# Patient Record
Sex: Female | Born: 1995
Health system: Southern US, Community
[De-identification: ages and names within clinical notes are randomized; demographics above are authoritative.]

## PROBLEM LIST (undated history)

## (undated) DIAGNOSIS — T7840XA Allergy, unspecified, initial encounter: Secondary | ICD-10-CM

## (undated) DIAGNOSIS — R51 Headache: Secondary | ICD-10-CM

## (undated) DIAGNOSIS — R519 Headache, unspecified: Secondary | ICD-10-CM

## (undated) HISTORY — DX: Allergy, unspecified, initial encounter: T78.40XA

## (undated) HISTORY — DX: Headache, unspecified: R51.9

## (undated) HISTORY — DX: Headache: R51

## (undated) HISTORY — PX: MYRINGOTOMY WITH TUBE PLACEMENT: SHX5663

---
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
## 2017-01-14 ENCOUNTER — Telehealth: Payer: Self-pay | Admitting: Internal Medicine

## 2017-01-14 NOTE — Telephone Encounter (Signed)
Lm to call office

## 2017-01-14 NOTE — Telephone Encounter (Signed)
Pt mom called stating she would like to get a copy of her immunization record. Please and thank you!   Call mom @ [phone].

## 2017-01-17 NOTE — Telephone Encounter (Signed)
lmtrc

## 2017-01-21 NOTE — Telephone Encounter (Signed)
Called mom x 3 l/m that we will not attempt to contact. If she still has questions about immunizations to call our office.

## 2017-03-19 ENCOUNTER — Ambulatory Visit
Admission: EM | Admit: 2017-03-19 | Discharge: 2017-03-19 | Disposition: A | Discharge status: Home / Self Care | Payer: 59 | Attending: Registered Nurse | Admitting: Registered Nurse

## 2017-03-19 ENCOUNTER — Telehealth: Payer: Self-pay | Admitting: Internal Medicine

## 2017-03-19 DIAGNOSIS — R000 Tachycardia, unspecified: Secondary | ICD-10-CM | POA: Diagnosis not present

## 2017-03-19 DIAGNOSIS — J301 Allergic rhinitis due to pollen: Secondary | ICD-10-CM | POA: Diagnosis not present

## 2017-03-19 DIAGNOSIS — R232 Flushing: Secondary | ICD-10-CM

## 2017-03-19 DIAGNOSIS — R61 Generalized hyperhidrosis: Secondary | ICD-10-CM | POA: Diagnosis not present

## 2017-03-19 DIAGNOSIS — I10 Essential (primary) hypertension: Secondary | ICD-10-CM | POA: Diagnosis not present

## 2017-03-19 DIAGNOSIS — H6593 Unspecified nonsuppurative otitis media, bilateral: Secondary | ICD-10-CM | POA: Insufficient documentation

## 2017-03-19 DIAGNOSIS — R51 Headache: Secondary | ICD-10-CM | POA: Diagnosis present

## 2017-03-19 DIAGNOSIS — J0120 Acute ethmoidal sinusitis, unspecified: Secondary | ICD-10-CM | POA: Diagnosis not present

## 2017-03-19 LAB — CBC WITH DIFFERENTIAL/PLATELET
Basophils Absolute: 0 10*3/uL (ref 0–0.1)
Basophils Relative: 0 %
Eosinophils Absolute: 0 10*3/uL (ref 0–0.7)
Eosinophils Relative: 0 %
HCT: 45.6 % (ref 35.0–47.0)
Hemoglobin: 15.5 g/dL (ref 12.0–16.0)
Lymphocytes Relative: 13 %
Lymphs Abs: 1.5 10*3/uL (ref 1.0–3.6)
MCH: 29.2 pg (ref 26.0–34.0)
MCHC: 33.9 g/dL (ref 32.0–36.0)
MCV: 86.1 fL (ref 80.0–100.0)
Monocytes Absolute: 0.3 10*3/uL (ref 0.2–0.9)
Monocytes Relative: 2 %
Neutro Abs: 9.7 10*3/uL — ABNORMAL HIGH (ref 1.4–6.5)
Neutrophils Relative %: 85 %
Platelets: 284 10*3/uL (ref 150–440)
RBC: 5.3 MIL/uL — ABNORMAL HIGH (ref 3.80–5.20)
RDW: 12.5 % (ref 11.5–14.5)
WBC: 11.6 10*3/uL — ABNORMAL HIGH (ref 3.6–11.0)

## 2017-03-19 LAB — COMPREHENSIVE METABOLIC PANEL
ALBUMIN: 5.5 g/dL — AB (ref 3.5–5.0)
ALT: 12 U/L — AB (ref 14–54)
ANION GAP: 9 (ref 5–15)
AST: 22 U/L (ref 15–41)
Alkaline Phosphatase: 98 U/L (ref 38–126)
BUN: 11 mg/dL (ref 6–20)
CALCIUM: 10 mg/dL (ref 8.9–10.3)
CO2: 28 mmol/L (ref 22–32)
CREATININE: 0.62 mg/dL (ref 0.44–1.00)
Chloride: 102 mmol/L (ref 101–111)
GFR calc Af Amer: >60 mL/min (ref >60)
GFR calc non Af Amer: >60 mL/min (ref >60)
GLUCOSE: 111 mg/dL — AB (ref 65–99)
Potassium: 3.7 mmol/L (ref 3.5–5.1)
Sodium: 139 mmol/L (ref 135–145)
TOTAL PROTEIN: 9.6 g/dL — AB (ref 6.5–8.1)
Total Bilirubin: 0.7 mg/dL (ref 0.3–1.2)

## 2017-03-19 LAB — TSH: TSH: 1.35 u[IU]/mL (ref 0.350–4.500)

## 2017-03-19 LAB — MONONUCLEOSIS SCREEN: Mono Screen: NEGATIVE

## 2017-03-19 LAB — HCG, QUANTITATIVE, PREGNANCY

## 2017-03-19 MED ORDER — SALINE SPRAY 0.65 % NA SOLN: 2.0000 | NASAL | 0 refills | Status: DC

## 2017-03-19 MED ORDER — AMOXICILLIN 875 MG PO TABS
875.0000 mg | ORAL_TABLET | Freq: Two times a day (BID) | ORAL | 0 refills | Status: AC
Start: 2017-03-19 — End: 2017-03-29

## 2017-03-19 MED ORDER — FLUTICASONE PROPIONATE 50 MCG/ACT NA SUSP: 1.0000 | Freq: Two times a day (BID) | NASAL | 0 refills | Status: DC

## 2017-03-19 MED ORDER — PREDNISONE 50 MG PO TABS: ORAL_TABLET | ORAL | 0 refills | Status: DC

## 2017-03-19 NOTE — Telephone Encounter (Signed)
Is she having acute symptoms now?  If so, then needs to be seen more acutely to try and confirm etiology - then I can f/u after.   If calling because having intermittent episodes, will have to find work in appt.

## 2017-03-19 NOTE — Telephone Encounter (Signed)
Pt mom called and stated that for the past 2 months the pt would start profusely sweating out of no where along with hand tremors and nausea. Pt has this about 1 to 2 times a week. Pt mom would like to have an appt with Dr. Nicki Reaper.  Please advise, thank you!  Call pt @ [phone]

## 2017-03-19 NOTE — Telephone Encounter (Signed)
Spoke with Horris Latino per DPR  Symptoms were occurring at 1:00 pm today.   Patient states she feels very fatigued, achy, nauseous, has headache.  Mom is going to get her and take to urgent care for further work up.   She will let us know what she finds out.

## 2017-03-19 NOTE — Telephone Encounter (Signed)
Reason for call:  Diffused sweating, Symptoms: feels like hot flashes, does get headache,  hands tremors, profuse sweating, nausea, felt like eyes crossing  sweating, hands trembling, last time ate 12:30 pm 100 pm , appetite decreased  Duration 2 months  No family history of diabetes , no injuries, no falls,  Medications:imipramine  Last seen for this problem: Seen by: Spoke with mom Horris Latino [phone]  Please advise

## 2017-03-19 NOTE — ED Triage Notes (Signed)
Patient complains of episodes of sweating, hot flashes, headaches, nausea. Patient states that this episode occurred around 1pm today. Patient states that she called primary and they told her to come in here. Patient states that she felt like her legs are weak.

## 2017-03-19 NOTE — ED Provider Notes (Signed)
CSN: [id number]     Arrival date & time 03/19/17  1757 History   First MD Initiated Contact with Patient 03/19/17 1828     Chief Complaint  Patient presents with  . Headache   (Consider location/radiation/quality/duration/timing/severity/associated sxs/prior Treatment) 21y/o single caucasian female here for evaluation of recurrent hot flashes, sweating not feeling well, occasionally hand tremors that started two months ago.  They do not occur daily but at least weekly. Unsure if her pulse elevates when these episodes occur denied history of palpitations, high/low blood sugars, electrolyte imbalances, anemia. Parents accompanying patient in exam room.  Had mononucleosis 1 year ago found on routine exam with PCM denied nausea/vomiting/fatigue/abdomen pain.  Today at work started to get headache and not feeling well.  Not her usual headache.  Some pressure behind eyes/bridge of nose,sore throat upon awakening/post nasal drip; has noticed increased runny nose/watery eyes has been taking her allegra the past week. 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 Parents brought her to urgent care after work.  Appetite regular denied body aches, nausea, vomiting, diarrhea, abdomen pain. Some kids with rash (ringworm)/URI at work.  Only 1 international child from [place] at daycare.  Had PPD performed for her [facility] transfer negative/normal in past year.   Denied weight loss/weight gain  Has a boyfriend denied sexual activity not on birth control  LMP started yesterday regular every month  Per mother patient has been tested for MS in the past due to mother diagnosis and child negative workup  PMHx recurrent ear infections, headaches, seasonal allergies typically spring PSHx PE Tubes  FHx Mother MS and palpitations (doesn't register on EKG when she has them)  MGF-PVCs, pacemaker and ablation mother and patient denied family  history diabetes, thyroid disorders      Past Medical History:  Diagnosis Date  . Allergy   . Headache    Past Surgical History:  Procedure Laterality Date  . MYRINGOTOMY WITH TUBE PLACEMENT     x 2   Family History  Problem Relation Age of Onset  . Multiple sclerosis Mother   . Breast cancer      grandmother  . Multiple sclerosis      mother  . Heart disease      grandparent  . Multiple sclerosis Maternal Uncle    Social History  Substance Use Topics  . Smoking status: Never Smoker  . Smokeless tobacco: Never Used  . Alcohol use No   OB History    No data available     Review of Systems  Constitutional: Positive for diaphoresis. Negative for activity change, appetite change, chills, fatigue and fever.  HENT: Positive for congestion, postnasal drip, rhinorrhea, sinus pain, sinus pressure, sneezing and sore throat. Negative for dental problem, drooling, ear discharge, ear pain, facial swelling, hearing loss, mouth sores, nosebleeds, tinnitus, trouble swallowing and voice change.   Eyes: Positive for pain, discharge and itching. Negative for photophobia, redness and visual disturbance.  Respiratory: Negative for apnea, cough, choking, chest tightness, shortness of breath, wheezing and stridor.   Cardiovascular: Negative for chest pain, palpitations and leg swelling.  Gastrointestinal: Negative for abdominal distention, abdominal pain, anal bleeding, blood in stool, constipation, diarrhea, nausea, rectal pain and vomiting.  Endocrine: Negative for cold intolerance, heat intolerance, polydipsia, polyphagia and polyuria.  Genitourinary: Positive for vaginal bleeding. Negative for decreased urine volume, difficulty urinating, flank pain, frequency, hematuria, menstrual problem,  pelvic pain, vaginal discharge and vaginal pain.  Musculoskeletal: Negative for arthralgias, back pain, gait problem, joint swelling, myalgias, neck pain and neck stiffness.  Skin: Negative for color  change, pallor, rash and wound.  Allergic/Immunologic: Positive for environmental allergies. Negative for food allergies and immunocompromised state.  Neurological: Positive for tremors and headaches. Negative for dizziness, seizures, syncope, facial asymmetry, speech difficulty, weakness, light-headedness and numbness.  Hematological: Negative for adenopathy. Does not bruise/bleed easily.  Psychiatric/Behavioral: Negative for agitation, behavioral problems, confusion, decreased concentration, hallucinations, self-injury, sleep disturbance and suicidal ideas. The patient is not nervous/anxious and is not hyperactive.     Allergies  Patient has no known allergies.  Home Medications   Prior to Admission medications   Medication Sig Start Date End Date Taking? Authorizing Provider  fexofenadine (ALLEGRA) 180 MG tablet Take 180 mg by mouth as needed for allergies or rhinitis.   Yes Historical Provider, MD  imipramine (TOFRANIL) 10 MG tablet One or two at bedtime 08/23/16  Yes Britt Bottom, MD  amoxicillin (AMOXIL) 875 MG tablet Take 1 tablet (875 mg total) by mouth 2 (two) times daily. 03/19/17 03/29/17  Olen Cordial, NP  fluticasone (FLONASE) 50 MCG/ACT nasal spray Place 1 spray into both nostrils 2 (two) times daily. 03/19/17   Olen Cordial, NP  predniSONE (DELTASONE) 50 MG tablet Take 50mg  po daily with breakfast 03/19/17   Olen Cordial, NP  sodium chloride (OCEAN) 0.65 % SOLN nasal spray Place 2 sprays into both nostrils every 2 (two) hours while awake. 03/19/17 04/18/17  Olen Cordial, NP   Meds Ordered and Administered this Visit  Medications - No data to display  BP 125/84 (BP Location: Left Arm)   Pulse (!) 111   Temp 98.2 F (36.8 C) (Oral)   Resp 18   Wt 125 lb (56.7 kg)   LMP 03/18/2017   SpO2 100%   BMI 21.97 kg/m  No data found.   Physical Exam  Constitutional: She is oriented to person, place, and time. She appears well-developed and well-nourished. She  is active and cooperative.  Non-toxic appearance. She does not have a sickly appearance. She does not appear ill. No distress.  HENT:  Head: Normocephalic and atraumatic.  Right Ear: Hearing, external ear and ear canal normal. Tympanic membrane is scarred. A middle ear effusion is present.  Left Ear: Hearing, external ear and ear canal normal. Tympanic membrane is scarred. A middle ear effusion is present.  Nose: Mucosal edema and rhinorrhea present. No nose lacerations, sinus tenderness, nasal deformity, septal deviation or nasal septal hematoma. No epistaxis.  No foreign bodies. Right sinus exhibits no maxillary sinus tenderness and no frontal sinus tenderness. Left sinus exhibits no maxillary sinus tenderness and no frontal sinus tenderness.  Mouth/Throat: Uvula is midline and mucous membranes are normal. Mucous membranes are not pale, not dry and not cyanotic. She does not have dentures. No oral lesions. No trismus in the jaw. Normal dentition. No dental abscesses, uvula swelling, lacerations or dental caries. Posterior oropharyngeal edema and posterior oropharyngeal erythema present. No oropharyngeal exudate or tonsillar abscesses. Tonsils are 1+ on the right. Tonsils are 1+ on the left. No tonsillar exudate.  Cobblestoning posterior pharynx; bilateral nasal turbinates edema/erythema clear discharge; scarring bilateral TMs (12-6pm) and air fluid level clear; proximal external auditory canal debris adjacent to TM left white; bilateral allergic shiners  Eyes: Conjunctivae, EOM and lids are normal. Pupils are equal, round, and reactive to light. Right eye exhibits no chemosis, no  discharge, no exudate and no hordeolum. No foreign body present in the right eye. Left eye exhibits no chemosis, no discharge, no exudate and no hordeolum. No foreign body present in the left eye. Right conjunctiva is not injected. Right conjunctiva has no hemorrhage. Left conjunctiva is not injected. Left conjunctiva has no  hemorrhage. No scleral icterus. Right eye exhibits normal extraocular motion and no nystagmus. Left eye exhibits normal extraocular motion and no nystagmus. Right pupil is round and reactive. Left pupil is round and reactive. Pupils are equal.  Neck: Trachea normal, normal range of motion and phonation normal. Neck supple. No tracheal tenderness, no spinous process tenderness and no muscular tenderness present. No neck rigidity. No tracheal deviation, no edema, no erythema and normal range of motion present. No thyroid mass and no thyromegaly present.  Cardiovascular: Normal rate, regular rhythm, S1 normal, S2 normal, normal heart sounds and intact distal pulses.  PMI is not displaced.  Exam reveals no gallop and no friction rub.   No murmur heard. Pulses:      Radial pulses are 2+ on the right side, and 2+ on the left side.  During initial physical exam heart rate 85 and during discharge patient stated she started to feel hot repeat pulse 130 regular rhythm radial and EKG ordered to verify presence/absence arrhythmia, PACs, PVCs none noted but patient became flushed and was not feeling well similar to her reported symptoms from the previous two months  Pulmonary/Chest: Effort normal and breath sounds normal. No accessory muscle usage or stridor. No respiratory distress. She has no decreased breath sounds. She has no wheezes. She has no rhonchi. She has no rales. She exhibits no tenderness.  Abdominal: Soft. Normal appearance and bowel sounds are normal. She exhibits no shifting dullness, no distension, no pulsatile liver, no fluid wave, no abdominal bruit, no ascites, no pulsatile midline mass and no mass. There is no hepatosplenomegaly, splenomegaly or hepatomegaly. There is no tenderness. There is no rigidity, no rebound, no guarding, no CVA tenderness, no tenderness at McBurney's point and negative Murphy's sign. No hernia. Hernia confirmed negative in the ventral area.  Dull to percussion RLQ, LLQ, RUQ  and tympanny LUQ; normoactive bowel sounds x 4 quads  Musculoskeletal: Normal range of motion. She exhibits no edema or tenderness.       Right shoulder: Normal.       Left shoulder: Normal.       Right hip: Normal.       Left hip: Normal.       Right knee: Normal.       Left knee: Normal.       Right ankle: Normal.       Left ankle: Normal.       Cervical back: Normal.       Right hand: Normal.       Left hand: Normal.  Lymphadenopathy:       Head (right side): No submental, no submandibular, no tonsillar, no preauricular, no posterior auricular and no occipital adenopathy present.       Head (left side): No submental, no submandibular, no tonsillar, no preauricular, no posterior auricular and no occipital adenopathy present.    She has no cervical adenopathy.       Right cervical: No superficial cervical, no deep cervical and no posterior cervical adenopathy present.      Left cervical: No superficial cervical, no deep cervical and no posterior cervical adenopathy present.  Neurological: She is alert and oriented to person, place,  and time. She has normal strength. She is not disoriented. She displays no atrophy and no tremor. No cranial nerve deficit or sensory deficit. She exhibits normal muscle tone. She displays no seizure activity. Coordination and gait normal. GCS eye subscore is 4. GCS verbal subscore is 5. GCS motor subscore is 6.  Skin: Skin is warm, dry and intact. Capillary refill takes less than 2 seconds. No abrasion, no bruising, no burn, no ecchymosis, no laceration, no lesion, no petechiae and no rash noted. She is not diaphoretic. No cyanosis or erythema. No pallor. Nails show no clubbing.  Psychiatric: She has a normal mood and affect. Her speech is normal and behavior is normal. Judgment and thought content normal. She is not actively hallucinating. Cognition and memory are normal. She is attentive.  Nursing note and vitals reviewed.   Urgent Care Course      .EKG Date/Time: 03/19/2017 7:47 PM Performed by: Gerarda Fraction A Authorized by: Gerarda Fraction A   ECG reviewed by ED Physician in the absence of a cardiologist: no   Previous ECG:    Previous ECG:  Unavailable Interpretation:    Interpretation: non-specific   Rate:    ECG rate:  119   ECG rate assessment: tachycardic   Rhythm:    Rhythm: sinus tachycardia   Ectopy:    Ectopy: none   QRS:    QRS axis:  Normal   QRS intervals:  Normal Conduction:    Conduction: normal   ST segments:    ST segments:  Normal T waves:    T waves: normal   Comments:     Pr interval 149ms QRS duration 31ms Qt/Qtc 316/453ms PRT axes 66 59 57    (including critical care time)  Labs Review Labs Reviewed  CBC WITH DIFFERENTIAL/PLATELET - Abnormal; Notable for the following:       Result Value   WBC 11.6 (*)    RBC 5.30 (*)    Neutro Abs 9.7 (*)    All other components within normal limits  COMPREHENSIVE METABOLIC PANEL - Abnormal; Notable for the following:    Glucose, Bld 111 (*)    Total Protein 9.6 (*)    Albumin 5.5 (*)    ALT 12 (*)    All other components within normal limits  TSH  MONONUCLEOSIS SCREEN  HCG, QUANTITATIVE, PREGNANCY  HEMOGLOBIN A1C    Imaging Review No results found.  1933 Reviewed labs results with patient and parents and given copy.  Discussed elevated RBC, WBC, neutro Abs, glucose, total protein, albumin.  Less than normal ALT.  Compared these results to one year ago also. Pregnancy test negative, mononucleosis negative.  TSH and HgbA1C still pending will call when results available.  Mother's cell Horris Latino) [phone]  May leave message if no answer. Parents and patient verbalized understanding information and had no further questions at this time  1943 patient started to not feel well flushed and mother noted on patient apple watch heart rate 126.  Ordered EKG.  Patient reported these symptoms similar to those she had been experiencing over the  past two months.  EKG tachycardia no PVCs/PACs noted.  Radial pulse regular.  Patient cheeks became flushed.  We had her lie down and applied cool compress to forehead.  Shirt was removed for EKG and she was wearing sports bra felt better/"hot flash"resolved and pulse returned to baseline 112 on admit/triage.  Discussed with parents and patient that holter monitor/cardiac workup may be indicated due to family history of  palpitations/PVCs mother and grandfather.  Departed ambulatory in NAD with parents driving her home.    MDM   1. Acute non-recurrent ethmoidal sinusitis   2. Otitis media with effusion, bilateral   3. Seasonal allergic rhinitis due to pollen   4. Diaphoresis   5. Tachycardia    Patient refused work/school excuse x 24 hours.  Restart flonase 1 spray each nostril BID at home.  Nasal saline 2 sprays each nostril q2h wa.  Shower bid.  Continue allegra po daily.  Start amoxicillin 875mg  po BID x 10 days #20 RF0. Discussed amoxicillin also treats for strep throat and ear infections. If no improvement with sinus pain/pressure x 48 hours amoxicillin/flonase/saline/allegra fill prednisone 50mg  po daily with breakfast x 5 days #5 RF0.  Discussed with patient and parents prednisone can increase or decrease appetite, cause insomnia.  If start prednisone and headache occurs stop as it can raise blood pressure and interact with her tofranil to raise blood pressure more than usual.  Mother has blood pressure cuff at home and can check her daughter's BP.  Mother works in healthcare also.  Recommended repeat labs in 2-4 weeks after current illness resolves.  Sooner if new or worsening symptoms. No evidence of systemic bacterial infection, non toxic and well hydrated.  I do not see where any further testing or imaging is necessary at this time.   I will suggest supportive care, rest, good hygiene and encourage the patient to take adequate fluids.  The patient is to return to clinic or EMERGENCY ROOM if  symptoms worsen or change significantly.  Exitcare handout on sinusitis given to patient.  Patient and parents verbalized agreement and understanding of treatment plan and had no further questions at this time.   P2:  Hand washing and cover cough   Discussed possible causes of "hot flashes"/sweating/not feeling well with patient and parents e.g. Hyper/hypoglyceia, hyper/hypothyroidism, diabetes, hormonal changes, electrolyte imbalances, cardiac disturbances e.g. PVCs/PSVT/SVT, pregnancy, infections.  Labs drawn EKG in clinic  EKG tachycardia without PACs/PVCs.  Lab Results available prior to discharge discussed with parents and patient.  Will call with others typically within next 48 hours as ship out to [employer].  Patient and parents verbalized understanding and had no further questions at this time.  Patient may use normal saline nasal spray as needed.  Continue  antihistamine (allegra) and restart nasal steroid (flonase at home) use.  Discussed flonase also can be used to treat viral and allergic rhinitis/sinusitis whereas amoxicillin only covers bacterial sinusitis not allergic/viral.  Avoid triggers if possible.  Shower prior to bedtime if exposed to triggers.  If allergic dust/dust mites recommend mattress/pillow covers/encasements; washing linens, vacuuming, sweeping, dusting weekly.  Call or return to clinic as needed if these symptoms worsen or fail to improve as anticipated.   Exitcare handout on allergic rhinitis given to patient.  Patient verbalized understanding of instructions, agreed with plan of care and had no further questions at this time.  P2:  Avoidance and hand washing.   Supportive treatment. Tylenol 1000mg  po QID prn.  Discussed motrin/advil/aleve/ibuprofen/naproxen all in same family choose one and max dose motrin/advil/ibuprofen 800mg  po TID.  No evidence of invasive bacterial infection, non toxic and well hydrated.  This is most likely self limiting viral infection.  I do not see  where any further testing or imaging is necessary at this time.   I will suggest supportive care, rest, good hygiene and encourage the patient to take adequate fluids.  The patient is to return  to clinic or EMERGENCY ROOM if symptoms worsen or change significantly e.g. ear pain, fever, purulent discharge from ears or bleeding.  Exitcare handout on otitis media with effusion given to patient.  Patient verbalized agreement and understanding of treatment plan.      Olen Cordial, NP 03/20/17 [place], NP 03/20/17 [phone]

## 2017-03-20 NOTE — Telephone Encounter (Signed)
Reviewed note.  Was placed on abx, etc.  They did recommend f/u with cardiology.  Does she want me to go ahead and schedule appt with cardiology.  Then I can f/u after.  Make sure ok.  Thanks

## 2017-03-20 NOTE — Telephone Encounter (Signed)
Patient and family would like to proceed with cardiology evaluation due to patient resting Pulse 122 today but patient also started prednisone today. Patient has no symptoms at this time and advised if heart rate continued to rise or if symptoms occur needs to be evaluated at [facility] or ER .  Patient would prefer Dr. Burt Knack or Dr. Roxy Manns with [facility].

## 2017-03-20 NOTE — Telephone Encounter (Signed)
From report from urgent care patient was advised to see PCP and would need referral for cardiology patient had episode of Tachycardia at urgent care pulse 126 patient felt hot and flushed 2 ECG's were done during visit in chart. Tried to call and see how patient is today no answer left message.

## 2017-03-20 NOTE — Telephone Encounter (Signed)
Pt mom called back and stated that they went to [facility] last night and was advised that they needs to make an appt with Dr. Nicki Reaper ASAP., and that she also needs to go see a cardiologist. Please advise, thank you!  Call @ [phone]

## 2017-03-20 NOTE — Telephone Encounter (Signed)
Left message to return call to office.

## 2017-03-20 NOTE — Telephone Encounter (Signed)
Please call patients mother  Contact Horris Latino at [phone]

## 2017-03-21 ENCOUNTER — Encounter: Payer: Self-pay | Admitting: Registered Nurse

## 2017-03-21 ENCOUNTER — Telehealth: Payer: Self-pay | Admitting: Registered Nurse

## 2017-03-21 LAB — HEMOGLOBIN A1C
HEMOGLOBIN A1C: 5 % (ref 4.8–5.6)
MEAN PLASMA GLUCOSE: 97 mg/dL

## 2017-03-21 NOTE — Telephone Encounter (Signed)
I have placed the order for the referral.  See previous phone note.  Can we get an appt scheduled asap.  Thanks.   Dr Nicki Reaper

## 2017-03-21 NOTE — Telephone Encounter (Signed)
Patient's mother notified via telephone HgbA1c  5.0 estimated blood sugar 97 and TSH 1.35 both WNL.  Follow up with [facility] consider holter monitor/event monitor due to family history and observed tachycardia with similar symptoms to patient reported hot flashes in past two month.   Discussed stress/anxiety/illness can also cause tachycardia.  End of semester, working, illness and allergies flaring at this time.  Monitor diet ensure adequate protein intake as low protein/albumin--repeat labs with PCM in 4-6 weeks. Mother verbalized understanding information/instructions, agreed with plan of care and had no further questions at this time.

## 2017-03-21 NOTE — Telephone Encounter (Signed)
Thank you.  Will you let pt know if you haven't already.   Thanks again for all you do.

## 2017-03-21 NOTE — Telephone Encounter (Signed)
Referral has been sent to [facility] in [place] for [place]. They will contact the patient to get her in.

## 2017-03-22 DIAGNOSIS — R0609 Other forms of dyspnea: Secondary | ICD-10-CM | POA: Diagnosis not present

## 2017-03-22 DIAGNOSIS — R000 Tachycardia, unspecified: Secondary | ICD-10-CM | POA: Diagnosis not present

## 2017-03-27 DIAGNOSIS — R000 Tachycardia, unspecified: Secondary | ICD-10-CM | POA: Diagnosis not present

## 2017-04-11 ENCOUNTER — Ambulatory Visit: Payer: 59 | Admitting: Interventional Cardiology

## 2017-04-17 DIAGNOSIS — R000 Tachycardia, unspecified: Secondary | ICD-10-CM | POA: Diagnosis not present

## 2017-04-17 DIAGNOSIS — R0609 Other forms of dyspnea: Secondary | ICD-10-CM | POA: Diagnosis not present

## 2017-04-23 DIAGNOSIS — R000 Tachycardia, unspecified: Secondary | ICD-10-CM | POA: Diagnosis not present

## 2017-05-11 MED FILL — IMIPRAMINE HCL 10 MG TABLET: 10 | 90 days supply | Qty: 180 | Fill #2

## 2017-05-20 MED FILL — METOPROLOL SUCC ER 25 MG TA: 25 | 30 days supply | Qty: 60 | Fill #0

## 2017-06-11 ENCOUNTER — Encounter: Payer: 59 | Admitting: Internal Medicine

## 2017-06-17 ENCOUNTER — Ambulatory Visit (INDEPENDENT_AMBULATORY_CARE_PROVIDER_SITE_OTHER): Payer: 59 | Admitting: Internal Medicine

## 2017-06-17 ENCOUNTER — Encounter: Payer: Self-pay | Admitting: Internal Medicine

## 2017-06-17 DIAGNOSIS — G4700 Insomnia, unspecified: Secondary | ICD-10-CM | POA: Diagnosis not present

## 2017-06-17 DIAGNOSIS — R51 Headache: Secondary | ICD-10-CM | POA: Diagnosis not present

## 2017-06-17 DIAGNOSIS — Z0000 Encounter for general adult medical examination without abnormal findings: Secondary | ICD-10-CM | POA: Diagnosis not present

## 2017-06-17 DIAGNOSIS — R000 Tachycardia, unspecified: Secondary | ICD-10-CM | POA: Diagnosis not present

## 2017-06-17 DIAGNOSIS — R519 Headache, unspecified: Secondary | ICD-10-CM

## 2017-06-17 NOTE — Progress Notes (Signed)
Pre-visit discussion using our clinic review tool. No additional management support is needed unless otherwise documented below in the visit note.  

## 2017-06-17 NOTE — Progress Notes (Signed)
Patient ID: Karla Hughes, female   DOB: 21-Jul-1996, 21 y.o.   MRN: [id number]   Subjective:    Patient ID: Karla Hughes, female    DOB: 06-01-1996, 21 y.o.   MRN: [id number]  HPI  Patient here for her physical exam.  She is accompanied by her mother.  History obtained from both of them.   She reports she feels better.  Saw cardiology.  Had 24 hour holter monitor.  Had ECHO - EF 50% with mild TR.  Was started on metoprolol.  Dose increased when had problems with increased heart rate with exercise.  She is currently on 50mg  q day now and feels this is working well for her.  Recommended f/u in 3 months.  No chest pain.  Breathing stable.  No abdominal pain.  Bowels moving.  LMP 06/07/17.  Regular.  Seeing Dr Felecia Shelling for her headaches.  He placed her on imipramine.  Sleeping better.  Headaches better.     Past Medical History:  Diagnosis Date  . Allergy   . Headache    Past Surgical History:  Procedure Laterality Date  . MYRINGOTOMY WITH TUBE PLACEMENT     x 2   Family History  Problem Relation Age of Onset  . Multiple sclerosis Mother   . Breast cancer Unknown        grandmother  . Multiple sclerosis Unknown        mother  . Heart disease Unknown        grandparent  . Multiple sclerosis Maternal Uncle    Social History   Social History  . Marital status: Single    Spouse name: N/A  . Number of children: N/A  . Years of education: N/A   Social History Main Topics  . Smoking status: Never Smoker  . Smokeless tobacco: Never Used  . Alcohol use No  . Drug use: No  . Sexual activity: No   Other Topics Concern  . None   Social History Narrative  . None    Outpatient Encounter Prescriptions as of 06/17/2017  Medication Sig  . fexofenadine (ALLEGRA) 180 MG tablet Take 180 mg by mouth as needed for allergies or rhinitis.  Marland Kitchen imipramine (TOFRANIL) 10 MG tablet One or two at bedtime  . metoprolol succinate (TOPROL-XL) 25 MG 24 hr tablet Take 50 mg by mouth daily.  .  fluticasone (FLONASE) 50 MCG/ACT nasal spray Place 1 spray into both nostrils 2 (two) times daily. (Patient not taking: Reported on 06/17/2017)  . sodium chloride (OCEAN) 0.65 % SOLN nasal spray Place 2 sprays into both nostrils every 2 (two) hours while awake.  . [DISCONTINUED] predniSONE (DELTASONE) 50 MG tablet Take 50mg  po daily with breakfast   No facility-administered encounter medications on file as of 06/17/2017.     Review of Systems  Constitutional: Negative for appetite change and unexpected weight change.  HENT: Negative for congestion and sinus pressure.   Eyes: Negative for pain and visual disturbance.  Respiratory: Negative for cough, chest tightness and shortness of breath.   Cardiovascular: Negative for chest pain, palpitations and leg swelling.  Gastrointestinal: Negative for abdominal pain, diarrhea, nausea and vomiting.  Genitourinary: Negative for difficulty urinating and dysuria.  Musculoskeletal: Negative for back pain and joint swelling.  Skin: Negative for color change and rash.  Neurological: Negative for dizziness, light-headedness and headaches.  Hematological: Negative for adenopathy. Does not bruise/bleed easily.  Psychiatric/Behavioral: Negative for agitation and dysphoric mood.       Objective:  Physical Exam  Constitutional: She is oriented to person, place, and time. She appears well-developed and well-nourished. No distress.  HENT:  Nose: Nose normal.  Mouth/Throat: Oropharynx is clear and moist.  Eyes: Right eye exhibits no discharge. Left eye exhibits no discharge. No scleral icterus.  Neck: Neck supple. No thyromegaly present.  Cardiovascular: Normal rate and regular rhythm.   Pulmonary/Chest: Breath sounds normal. No accessory muscle usage. No tachypnea. No respiratory distress. She has no decreased breath sounds. She has no wheezes. She has no rhonchi. Right breast exhibits no inverted nipple, no mass, no nipple discharge and no tenderness (no  axillary adenopathy). Left breast exhibits no inverted nipple, no mass, no nipple discharge and no tenderness (no axilarry adenopathy).  Abdominal: Soft. Bowel sounds are normal. There is no tenderness.  Musculoskeletal: She exhibits no edema or tenderness.  Lymphadenopathy:    She has no cervical adenopathy.  Neurological: She is alert and oriented to person, place, and time.  Skin: Skin is warm. No rash noted. No erythema.  Psychiatric: She has a normal mood and affect. Her behavior is normal.    BP 126/82 (BP Location: Left Arm, Patient Position: Sitting, Cuff Size: Normal)   Pulse 98   Temp 98.6 F (37 C) (Oral)   Resp 12   Ht 5\' 3"  (1.6 m)   Wt 131 lb 6.4 oz (59.6 kg)   LMP 06/07/2017   SpO2 98%   BMI 23.28 kg/m  Wt Readings from Last 3 Encounters:  06/17/17 131 lb 6.4 oz (59.6 kg) (57 %, Z= 0.18)*  03/19/17 125 lb (56.7 kg) (46 %, Z= -0.09)*  08/23/16 129 lb 8 oz (58.7 kg) (58 %, Z= 0.20)*   * Growth percentiles are based on CDC 2-20 Years data.     Lab Results  Component Value Date   WBC 11.6 (H) 03/19/2017   HGB 15.5 03/19/2017   HCT 45.6 03/19/2017   PLT 284 03/19/2017   GLUCOSE 111 (H) 03/19/2017   ALT 12 (L) 03/19/2017   AST 22 03/19/2017   NA 139 03/19/2017   K 3.7 03/19/2017   CL 102 03/19/2017   CREATININE 0.62 03/19/2017   BUN 11 03/19/2017   CO2 28 03/19/2017   TSH 1.350 03/19/2017   HGBA1C 5.0 03/19/2017       Assessment & Plan:   Problem List Items Addressed This Visit    Headache    Improved on imipramine.  Sleeping better.  Being followed by Dr Felecia Shelling.       Relevant Medications   metoprolol succinate (TOPROL-XL) 25 MG 24 hr tablet   Healthcare maintenance    Physical today.        Insomnia    Sleeping better on imipramine.        Tachycardia    Saw cardiology.  On metoprolol.  Doing well on current dose.  Follow.           Einar Pheasant, MD

## 2017-06-19 ENCOUNTER — Encounter: Payer: Self-pay | Admitting: Internal Medicine

## 2017-06-19 DIAGNOSIS — R000 Tachycardia, unspecified: Secondary | ICD-10-CM | POA: Insufficient documentation

## 2017-06-19 DIAGNOSIS — Z0000 Encounter for general adult medical examination without abnormal findings: Secondary | ICD-10-CM | POA: Insufficient documentation

## 2017-06-19 MED FILL — METOPROLOL SUCC ER 25 MG TA: 25 | 30 days supply | Qty: 60 | Fill #1

## 2017-06-19 NOTE — Assessment & Plan Note (Signed)
Saw cardiology.  On metoprolol.  Doing well on current dose.  Follow.

## 2017-06-19 NOTE — Assessment & Plan Note (Signed)
Sleeping better on imipramine.

## 2017-06-19 NOTE — Assessment & Plan Note (Signed)
Improved on imipramine.  Sleeping better.  Being followed by Dr Felecia Shelling.

## 2017-06-19 NOTE — Assessment & Plan Note (Signed)
Physical today.

## 2017-07-16 MED FILL — METOPROLOL SUCC ER 25 MG TA: 25 | 30 days supply | Qty: 60 | Fill #2

## 2017-07-26 DIAGNOSIS — R000 Tachycardia, unspecified: Secondary | ICD-10-CM | POA: Diagnosis not present

## 2017-07-26 DIAGNOSIS — R0609 Other forms of dyspnea: Secondary | ICD-10-CM | POA: Diagnosis not present

## 2017-08-14 MED FILL — IMIPRAMINE HCL 10 MG TABLET: 10 | 90 days supply | Qty: 180 | Fill #3

## 2017-08-18 MED FILL — METOPROLOL SUCC ER 25 MG TA: 25 | 30 days supply | Qty: 60 | Fill #3

## 2017-08-27 ENCOUNTER — Ambulatory Visit (INDEPENDENT_AMBULATORY_CARE_PROVIDER_SITE_OTHER): Payer: 59 | Admitting: Neurology

## 2017-08-27 ENCOUNTER — Encounter: Payer: Self-pay | Admitting: Neurology

## 2017-08-27 VITALS — BP 118/81 | HR 91 | Resp 14 | Ht 63.0 in | Wt 133.5 lb

## 2017-08-27 DIAGNOSIS — G4700 Insomnia, unspecified: Secondary | ICD-10-CM | POA: Diagnosis not present

## 2017-08-27 DIAGNOSIS — R000 Tachycardia, unspecified: Secondary | ICD-10-CM

## 2017-08-27 DIAGNOSIS — R519 Headache, unspecified: Secondary | ICD-10-CM

## 2017-08-27 DIAGNOSIS — R51 Headache: Secondary | ICD-10-CM | POA: Diagnosis not present

## 2017-08-27 MED ORDER — IMIPRAMINE HCL 10 MG PO TABS: ORAL_TABLET | ORAL | 3 refills | Status: DC

## 2017-08-27 NOTE — Progress Notes (Signed)
[facility]  PATIENT: Karla Hughes DOB: [birth year]  REFERRING DOCTOR OR PCP:  Einar Pheasant, MD SOURCE: patient, records in EMR, Mother, records from Dr. Nicki Reaper.  _________________________________   HISTORICAL  CHIEF COMPLAINT:  Chief Complaint  Patient presents with  . Migraines    Sts. h/a's continue to be less frequent and less severe.  Continues to tolerate Imipramine well. Is also now on Toprol for tachycardia (not svt)/fim    HISTORY OF PRESENT ILLNESS:   Karla Hughes Is an 21 year old woman with frequent heaadache.  Currently, headaches are occurring once a week, sometimes triggered by caffeine.  When she does get a headache, it is often occipital. She does not get nausea or vomiting. The headaches seem to occur randomly and are not associated with her menstrual cycle or with other triggers. The rest of the week, she has no pain.    She tolerates imipramine well.  She is also on Toprol for a rapid pulse.    She will take Excedrin when a HA occurs.   Her headaches have begun about 3 years ago. MRI of the brain was normal.  She is sleeping well since starting imipramine.Marland Kitchen   She will only rarely wake up at night now.      She denies other symptoms.    Her family history shows that her mother and her maternal uncle have multiple sclerosis. Additionally, the mother and maternal great-grandmother have a history of meningioma.    REVIEW OF SYSTEMS: Constitutional: No fevers, chills, sweats, or change in appetite/.   Reports insomnia Eyes: No visual changes, double vision, eye pain Ear, nose and throat: No hearing loss, ear pain, nasal congestion, sore throat Cardiovascular: No chest pain, palpitations Respiratory: No shortness of breath at rest or with exertion.   No wheezes GastrointestinaI: No nausea, vomiting, diarrhea, abdominal pain, fecal incontinence Genitourinary: No dysuria, urinary retention or frequency.  No nocturia. Musculoskeletal:  No neck pain, back pain Integumentary: No rash, pruritus, skin lesions Neurological: as above Psychiatric: No depression at this time.  No anxiety Endocrine: No palpitations, diaphoresis, change in appetite, change in weigh or increased thirst Hematologic/Lymphatic: No anemia, purpura, petechiae. Allergic/Immunologic: No itchy/runny eyes, nasal congestion, recent allergic reactions, rashes  ALLERGIES: No Known Allergies  HOME MEDICATIONS:  Current Outpatient Prescriptions:  .  imipramine (TOFRANIL) 10 MG tablet, One or two at bedtime, Disp: 180 tablet, Rfl: 3 .  metoprolol succinate (TOPROL-XL) 25 MG 24 hr tablet, Take 50 mg by mouth daily., Disp: , Rfl:   PAST MEDICAL HISTORY: Past Medical History:  Diagnosis Date  . Allergy   . Headache     PAST SURGICAL HISTORY: Past Surgical History:  Procedure Laterality Date  . MYRINGOTOMY WITH TUBE PLACEMENT     x 2    FAMILY HISTORY: Family History  Problem Relation Age of Onset  . Multiple sclerosis Mother   . Breast cancer Unknown        grandmother  . Multiple sclerosis Unknown        mother  . Heart disease Unknown        grandparent  . Multiple sclerosis Maternal Uncle     SOCIAL HISTORY:  Social History   Social History  . Marital status: Single    Spouse name: N/A  . Number of children: N/A  . Years of education: N/A   Occupational History  . Not on file.   Social History Main Topics  . Smoking status: Never Smoker  . Smokeless tobacco:  Never Used  . Alcohol use No  . Drug use: No  . Sexual activity: No   Other Topics Concern  . Not on file   Social History Narrative  . No narrative on file     PHYSICAL EXAM  Vitals:   08/27/17 1440  BP: 118/81  Pulse: 91  Resp: 14  Weight: 133 lb 8 oz (60.6 kg)  Height: 5\' 3"  (1.6 m)    Body mass index is 23.65 kg/m.   General: The patient is well-developed and well-nourished and in no acute distress  Eyes:  Funduscopic exam shows normal optic  discs and retinal vessels.  Neck:  The neck is non-tender at the occiput.  Neurologic Exam  Mental status: The patient is alert and oriented x 3 at the time of the examination. The patient has apparent normal recent and remote memory, with an apparently normal attention span and concentration ability.   Speech is normal.  Cranial nerves: Extraocular movements are full. Pupils are equal, round, and reactive to light and accomodation.  Visual fields are full.  Facial symmetry is present. There is good facial sensation to soft touch bilaterally.Facial strength is normal.  Trapezius and sternocleidomastoid strength is normal. No dysarthria is noted.  The tongue is midline, and the patient has symmetric elevation of the soft palate. No obvious hearing deficits are noted.  Motor:  Muscle bulk is normal.   Tone is normal. Strength is  5 / 5 in all 4 extremities.   Sensory: Sensory testing is intact to  Touch in all 4 extremities.  Coordination: Cerebellar testing reveals good finger-nose-finger bilaterally.  Gait and station: Station is normal.   Gait is normal. Tandem gait is normal.    Reflexes: Deep tendon reflexes are symmetric and normal bilaterally.        DIAGNOSTIC DATA (LABS, IMAGING, TESTING) - I reviewed patient records, labs, notes, testing and imaging myself where available.  Lab Results  Component Value Date   WBC 11.6 (H) 03/19/2017   HGB 15.5 03/19/2017   HCT 45.6 03/19/2017   MCV 86.1 03/19/2017   PLT 284 03/19/2017      Component Value Date/Time   NA 139 03/19/2017 1845   K 3.7 03/19/2017 1845   CL 102 03/19/2017 1845   CO2 28 03/19/2017 1845   GLUCOSE 111 (H) 03/19/2017 1845   BUN 11 03/19/2017 1845   CREATININE 0.62 03/19/2017 1845   CALCIUM 10.0 03/19/2017 1845   PROT 9.6 (H) 03/19/2017 1845   ALBUMIN 5.5 (H) 03/19/2017 1845   AST 22 03/19/2017 1845   ALT 12 (L) 03/19/2017 1845   ALKPHOS 98 03/19/2017 1845   BILITOT 0.7 03/19/2017 1845   GFRNONAA >60  03/19/2017 1845   GFRAA >60 03/19/2017 1845    Lab Results  Component Value Date   TSH 1.350 03/19/2017       ASSESSMENT AND PLAN  Nonintractable headache, unspecified chronicity pattern, unspecified headache type  Insomnia, unspecified type  Tachycardia  1.  Imipramine 20 mg nightly.  When headache occurs, she can take Excedrin Migraine or other over-the-counter medication. 2.   Return in 1 year or sooner if there are new or worsening neurologic symptoms.  We discussed that if her primary care provider is willing to take over prescription of the imipramine at we can make any follow-up as needed  Vale Peraza A. Felecia Shelling, MD, PhD 40/11/270, 5:36 PM Certified in Neurology, Clinical Neurophysiology, Sleep Medicine, Pain Medicine and Neuroimaging  [facility] [address],  [place], [place] [address] [phone]

## 2017-09-19 MED FILL — METOPROLOL SUCC ER 25 MG TA: 25 | 30 days supply | Qty: 60 | Fill #4

## 2017-10-14 MED FILL — METOPROLOL SUCC ER 25 MG TA: 25 | 30 days supply | Qty: 60 | Fill #5

## 2017-11-14 ENCOUNTER — Other Ambulatory Visit: Payer: Self-pay | Admitting: Neurology

## 2017-11-14 MED FILL — IMIPRAMINE HCL 10 MG TABLET: 10 | 90 days supply | Qty: 180 | Fill #0

## 2017-11-22 MED FILL — METOPROLOL SUCC ER 25 MG TA: 25 | 25 days supply | Qty: 50 | Fill #6

## 2017-11-29 DIAGNOSIS — R0609 Other forms of dyspnea: Secondary | ICD-10-CM | POA: Diagnosis not present

## 2017-11-29 DIAGNOSIS — R000 Tachycardia, unspecified: Secondary | ICD-10-CM | POA: Diagnosis not present

## 2017-11-29 MED FILL — METOPROLOL SUCC ER 50 MG TA: 50 | 90 days supply | Qty: 135 | Fill #0

## 2018-01-10 DIAGNOSIS — R000 Tachycardia, unspecified: Secondary | ICD-10-CM | POA: Diagnosis not present

## 2018-02-17 IMAGING — MR MR HEAD WO/W CM
11 of 17 series · 29 of 48 positions shown · IV contrast (multihance)
Comparison: None.

CLINICAL DATA: Recent increase and headaches. Posterior headaches
extending to the left eye. First-degree family member with multiple
sclerosis. Family members with meningiomas and pineal cysts.

EXAM:
MRI HEAD WITHOUT AND WITH CONTRAST
TECHNIQUE: Multiplanar, multiecho pulse sequences of the brain and surrounding
structures were obtained without and with intravenous contrast.
CONTRAST:  12mL MULTIHANCE GADOBENATE DIMEGLUMINE 529 MG/ML IV SOLN

[Series 3: T1 · sagittal · 5.0mm · 0.47mm/px · 1 of 24 slices shown]
[im 1/24]
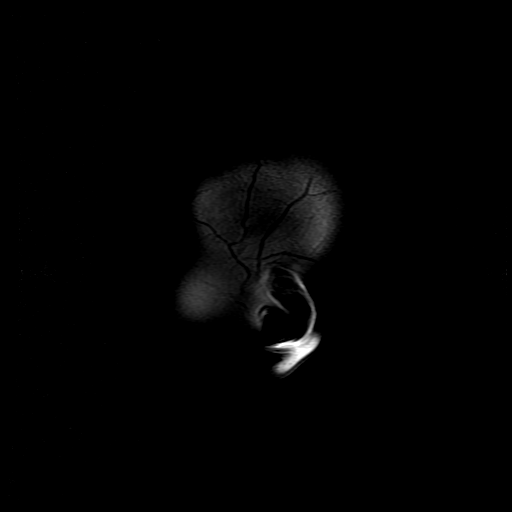

[Series 4: DWI · axial · 3.0mm · 1.09mm/px · z∈[-32,+124]mm · 6 of 106 slices shown (1 of 4)]
[im 1/106]
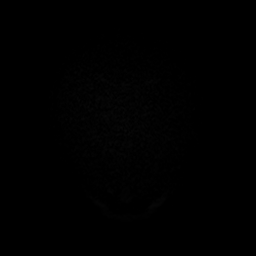
[im 22/106]
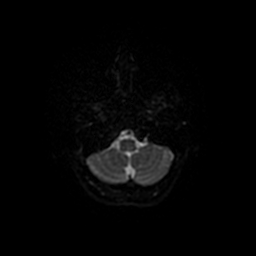
[im 43/106]
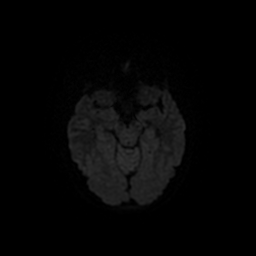
[im 64/106]
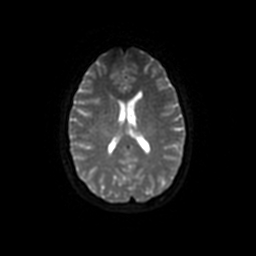
[im 85/106]
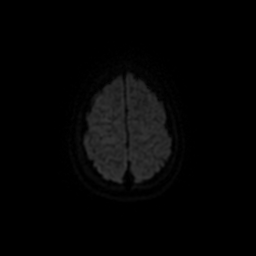
[im 106/106]
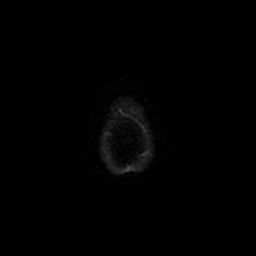

[Series 5: DWI · coronal · 5.0mm · 1.09mm/px · 4 of 72 slices shown (2 of 4)]
[im 1/72]
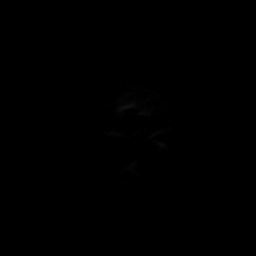
[im 24/72]
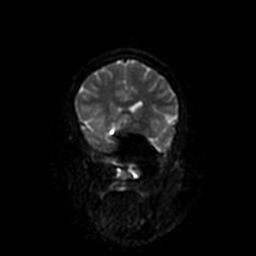
[im 48/72]
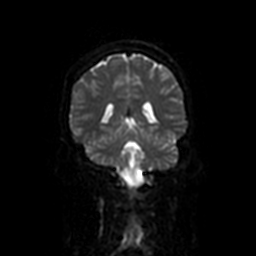
[im 72/72]
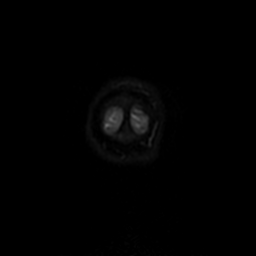

[Series 6: FLAIR · axial · 5.0mm · 0.43mm/px · 1 of 24 slices shown (1 of 2)]
[im 1/24]
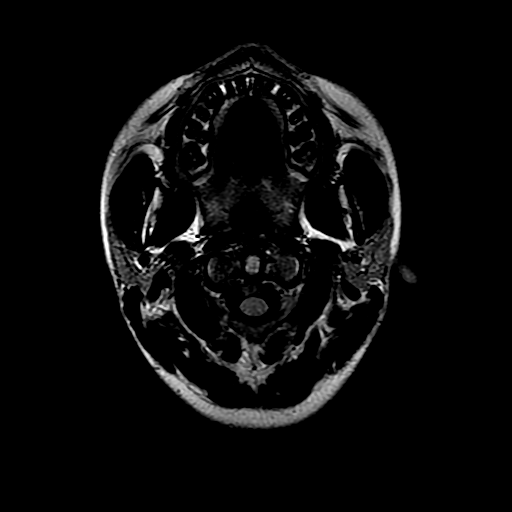

[Series 7: T2 · axial · 5.0mm · 0.43mm/px · 1 of 24 slices shown]
[im 1/24]
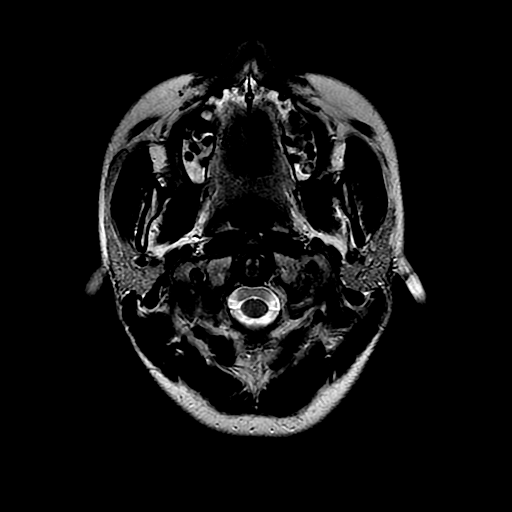

[Series 8: FLAIR · sagittal · 1.6mm · 0.47mm/px · 8 of 184 slices shown (2 of 2)]
[im 1/184]
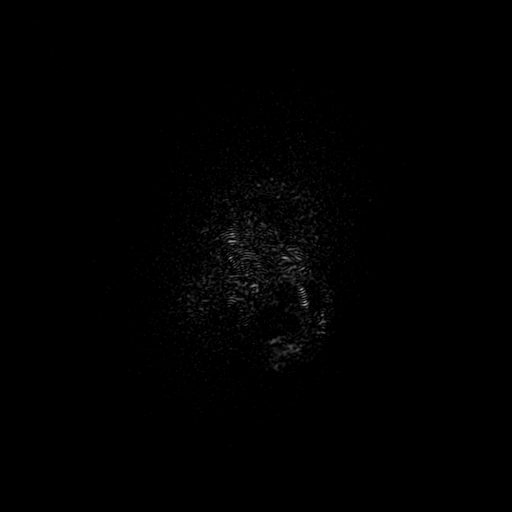
[im 21/184]
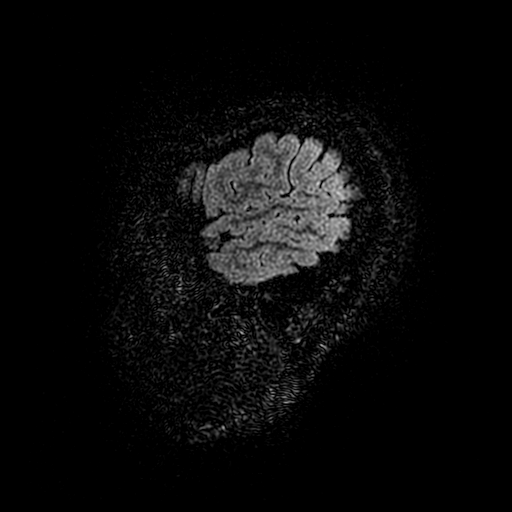
[im 62/184]
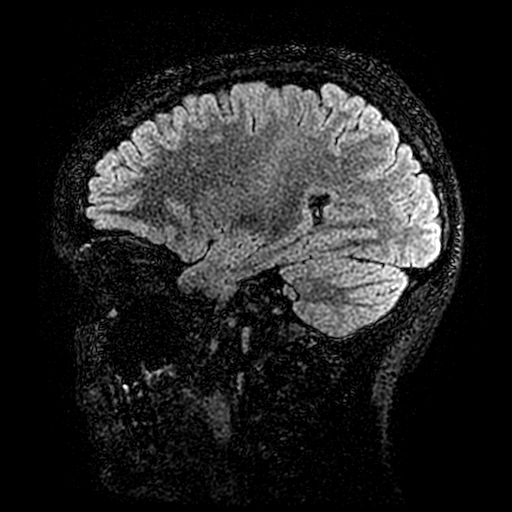
[im 82/184]
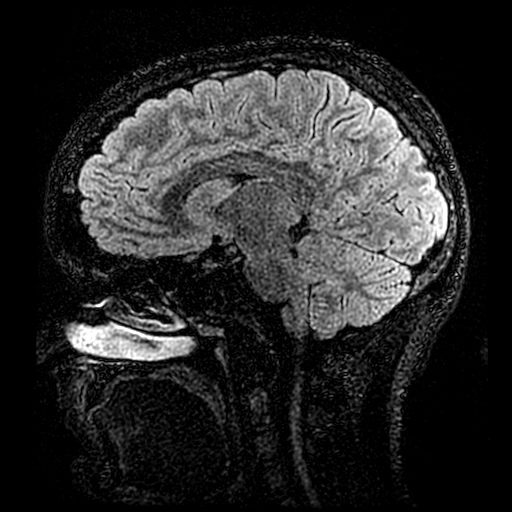
[im 102/184]
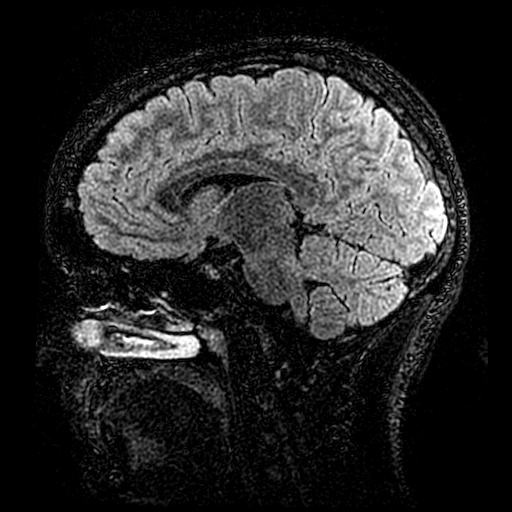
[im 123/184]
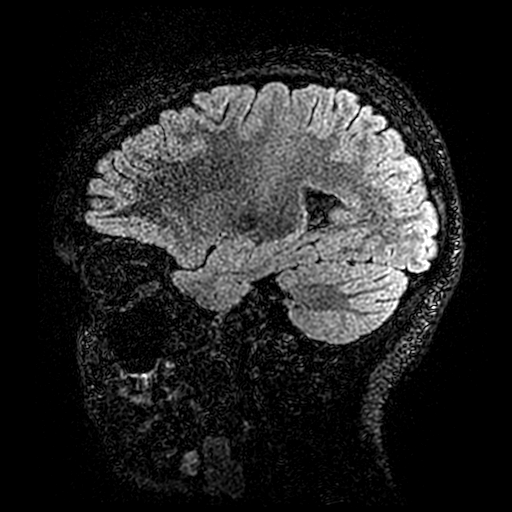
[im 163/184]
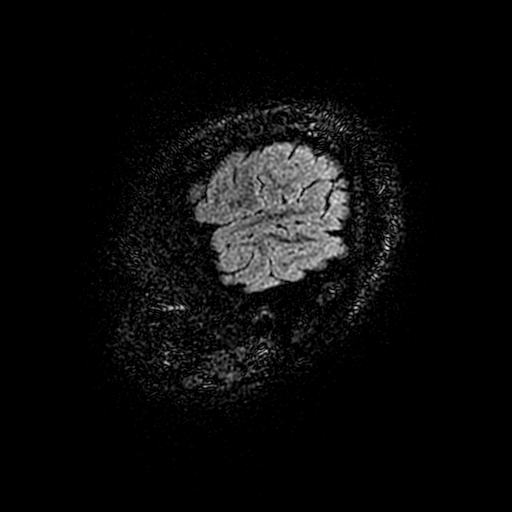
[im 184/184]
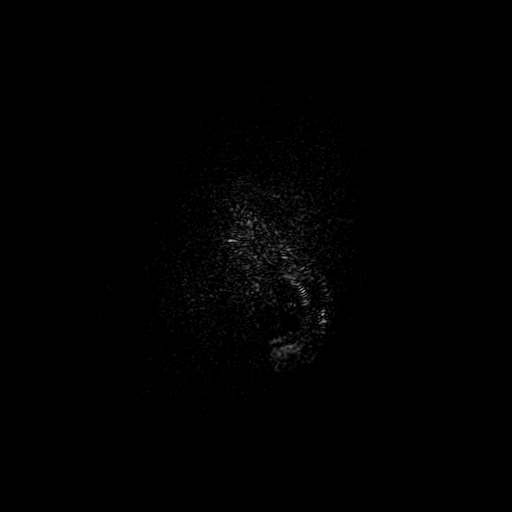

[Series 11: T2 post-contrast · coronal · 5.0mm · 0.45mm/px · 1 of 28 slices shown]
[im 1/28]
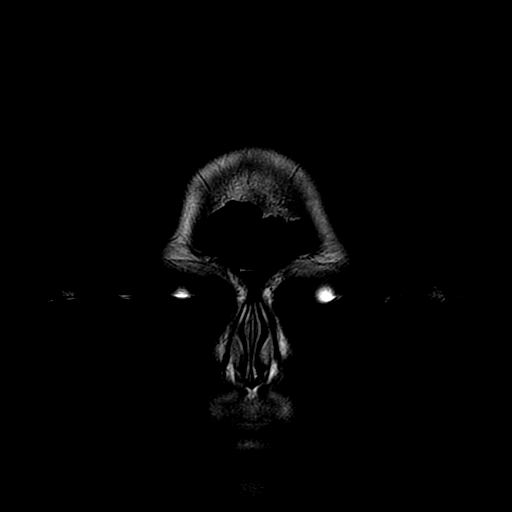

[Series 13: T1 post-contrast · coronal · 5.0mm · 0.45mm/px · 1 of 28 slices shown (1 of 2)]
[im 1/28]
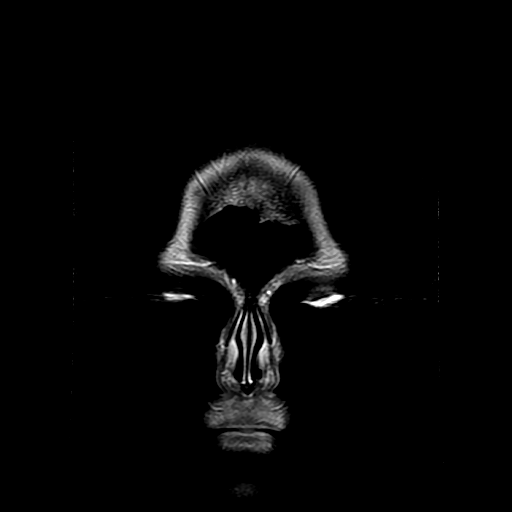

[Series 14: T1 post-contrast · sagittal · 5.0mm · 0.47mm/px · 1 of 24 slices shown (2 of 2)]
[im 1/24]
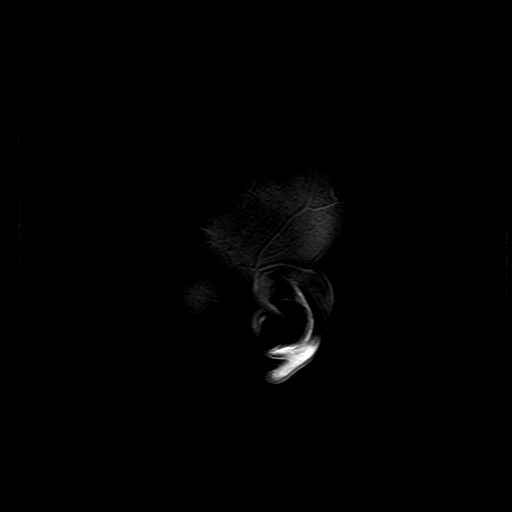

[Series 400: DWI · axial · 3.0mm · 1.09mm/px · z∈[-32,+124]mm · 3 of 53 slices shown (3 of 4)]
[im 1/53]
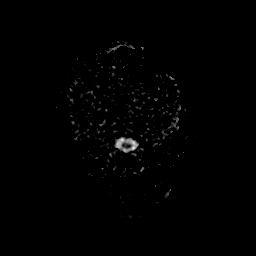
[im 27/53]
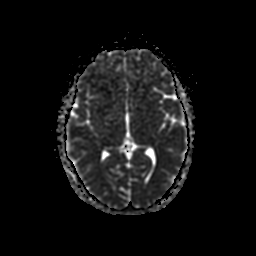
[im 53/53]
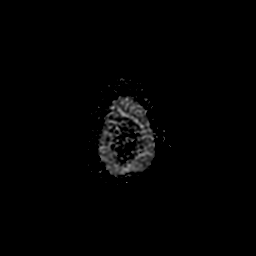

[Series 500: DWI · coronal · 5.0mm · 1.09mm/px · 2 of 36 slices shown (4 of 4)]
[im 1/36]
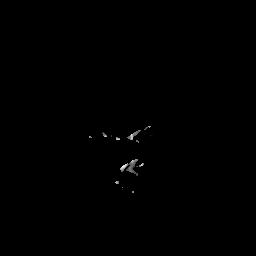
[im 36/36]
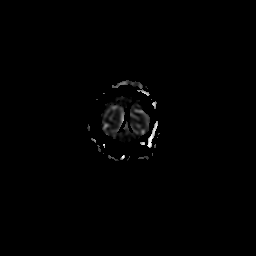

[29 of 48 positions shown; findings below may reference images not displayed]

FINDINGS: No acute infarct, hemorrhage, or mass lesion is present. The
ventricles are of normal size. No significant extraaxial fluid
collection is present.

No significant white matter disease is present. There is no imaging
evidence for demyelination.

No significant extra-axial lesions are present.

The postcontrast images demonstrate minimal linear enhancement in
the right basal ganglia compatible with a developmental venous
anomaly, essentially a normal variant. A benign appearing pineal
cyst measures 9 mm without a significant soft tissue enhancing
component. No follow-up is necessary. Pituitary gland is within
normal limits for age.

The internal auditory canals are normal bilaterally. Flow is present
in the major intracranial arteries.

The globes and orbits are intact. The skullbase is within normal
limits. Midline sagittal images are unremarkable.
IMPRESSION: Negative MRI the brain without and with contrast.

## 2018-02-21 MED FILL — IMIPRAMINE HCL 10 MG TABLET: 10 | 90 days supply | Qty: 180 | Fill #1

## 2018-03-04 ENCOUNTER — Other Ambulatory Visit: Payer: Self-pay | Admitting: Internal Medicine

## 2018-03-04 DIAGNOSIS — R000 Tachycardia, unspecified: Secondary | ICD-10-CM

## 2018-03-04 NOTE — Progress Notes (Signed)
Order placed for cardiology referral.   

## 2018-03-07 ENCOUNTER — Encounter: Payer: Self-pay | Admitting: Cardiology

## 2018-03-10 MED FILL — METOPROLOL SUCC ER 50 MG TA: 50 | 90 days supply | Qty: 135 | Fill #1

## 2018-03-18 ENCOUNTER — Encounter: Payer: Self-pay | Admitting: Cardiology

## 2018-03-18 ENCOUNTER — Ambulatory Visit: Payer: 59 | Admitting: Cardiology

## 2018-03-18 VITALS — BP 112/86 | HR 108 | Ht 64.0 in | Wt 135.6 lb

## 2018-03-18 DIAGNOSIS — R002 Palpitations: Secondary | ICD-10-CM | POA: Diagnosis not present

## 2018-03-18 DIAGNOSIS — R000 Tachycardia, unspecified: Secondary | ICD-10-CM

## 2018-03-18 MED ORDER — DILTIAZEM HCL ER COATED BEADS 180 MG PO CP24: 180.0000 mg | ORAL_CAPSULE | Freq: Every day | ORAL | 3 refills | Status: DC

## 2018-03-18 MED FILL — DILTIAZEM 24HR ER 180 MG CA: 180 | 30 days supply | Qty: 30 | Fill #0

## 2018-03-18 NOTE — Patient Instructions (Signed)
Medication Instructions: 1) STOP Metoprolol   2) START Diltiazem 180 mg once daily  Labwork: None ordered  Procedures/Testing: None ordered  Follow-Up: Your physician recommends that you schedule a follow-up appointment in: 3 months with Dr. Curt Bears.   Any Additional Special Instructions Will Be Listed Below (If Applicable).     If you need a refill on your cardiac medications before your next appointment, please call your pharmacy.

## 2018-03-18 NOTE — Progress Notes (Signed)
Electrophysiology Office Note   Date:  03/18/2018   ID:  Karla Hughes, DOB January 07, 1996, MRN [id number]  PCP:  Einar Pheasant, MD  Cardiologist:  [place] Primary Electrophysiologist:  Ferron Ishmael Meredith Leeds, MD    Chief Complaint  Patient presents with  . Advice Only    Sunus tachycardia     History of Present Illness: Karla Hughes is a 22 y.o. female who is being seen today for the evaluation of sinus tachycardia at the request of Karla Hughes. Presenting today for electrophysiology evaluation.  She was initially seen on 03/22/2017 with palpitations, hot flashes, nausea, headaches.  Episode occurred 2-3 times a day lasting 5 to 10 minutes with and without exertion.  Her apple watch revealed heart rates in the 170s with light exercise in the low 100s at rest.  She typically plays sports without issue.  24-hour monitor showed sinus rhythm with a mean heart rate of 101 with episodes of sinus tachycardia at 133 bpm.  Echo 04/17/2017 showed normal LV systolic function with an EF of 50% and mild tricuspid regurgitation.    Today, she denies symptoms of  chest pain, orthopnea, PND, lower extremity edema, claudication, dizziness, presyncope, syncope, bleeding, or neurologic sequela. The patient is tolerating medications without difficulties. Her metoprolol was increased recently. This helped her symptoms but she did continue to have DOE and palpitations. She also has fatigue that may be related to metoprolol.   Past Medical History:  Diagnosis Date  . Allergy   . Headache    Past Surgical History:  Procedure Laterality Date  . MYRINGOTOMY WITH TUBE PLACEMENT     x 2     Current Outpatient Medications  Medication Sig Dispense Refill  . imipramine (TOFRANIL) 10 MG tablet One or two at bedtime 180 tablet 3  . diltiazem (CARDIZEM CD) 180 MG 24 hr capsule Take 1 capsule (180 mg total) by mouth daily. 30 capsule 3   No current facility-administered medications for this  visit.     Allergies:   Patient has no known allergies.   Social History:  The patient  reports that she has never smoked. She has never used smokeless tobacco. She reports that she does not drink alcohol or use drugs.   Family History:  The patient's family history includes Breast cancer in her unknown relative; Heart disease in her unknown relative; Multiple sclerosis in her maternal uncle, mother, and unknown relative.    ROS:  Please see the history of present illness.   Otherwise, review of systems is positive for DOE, dizziness.   All other systems are reviewed and negative.    PHYSICAL EXAM: VS:  BP 112/86   Pulse (!) 108   Ht 5\' 4"  (1.626 m)   Wt 135 lb 9.6 oz (61.5 kg)   SpO2 99%   BMI 23.28 kg/m  , BMI Body mass index is 23.28 kg/m. GEN: Well nourished, well developed, in no acute distress  HEENT: normal  Neck: no JVD, carotid bruits, or masses Cardiac: RRR; no murmurs, rubs, or gallops,no edema  Respiratory:  clear to auscultation bilaterally, normal work of breathing GI: soft, nontender, nondistended, + BS MS: no deformity or atrophy  Skin: warm and dry Neuro:  Strength and sensation are intact Psych: euthymic mood, full affect  EKG:  EKG is ordered today. Personal review of the ekg ordered shows sinus rhythm, rate 108  Recent Labs: 03/19/2017: ALT 12; BUN 11; Creatinine, Ser 0.62; Hemoglobin 15.5; Platelets 284; Potassium 3.7; Sodium 139;  TSH 1.350    Lipid Panel  No results found for: CHOL, TRIG, HDL, CHOLHDL, VLDL, LDLCALC, LDLDIRECT   Wt Readings from Last 3 Encounters:  03/18/18 135 lb 9.6 oz (61.5 kg)  08/27/17 133 lb 8 oz (60.6 kg) (60 %, Z= 0.26)*  06/17/17 131 lb 6.4 oz (59.6 kg) (57 %, Z= 0.18)*   * Growth percentiles are based on CDC (Girls, 2-20 Years) data.      Other studies Reviewed: Additional studies/ records that were reviewed today include: TTE 04/18/17  Review of the above records today demonstrates:  NORMAL LEFT VENTRICULAR  SYSTOLIC FUNCTION NORMAL RIGHT VENTRICULAR SYSTOLIC FUNCTION MILD VALVULAR REGURGITATION (See above) NO VALVULAR STENOSIS EF 50%   ASSESSMENT AND PLAN:  1.  Sinus tachycardia: Attempt improved on low-dose metoprolol.  He takes 75 mg of metoprolol succinate daily.  She has been avoiding caffeine.  She is having quite a bit of fatigue and thus we Kenika Sahm plan to stop her metoprolol.  We Zen Cedillos start her on diltiazem 180 mg.  This can be increased if necessary.  I also told her to drink 2 to 3 L of fluid a day and increase her salt intake to 10 to 12 g.    Current medicines are reviewed at length with the patient today.   The patient does not have concerns regarding her medicines.  The following changes were made today:  Stop metoprolol, start diltiazem  Labs/ tests ordered today include:  Orders Placed This Encounter  Procedures  . EKG 12-Lead     Disposition:   FU with Millard Bautch 3 months  Signed, Tayten Bergdoll Meredith Leeds, MD  03/18/2018 3:46 PM     [place] [place] [address] [phone] (office) [phone] (fax)

## 2018-04-18 MED FILL — DILTIAZEM 24HR ER 180 MG CA: 180 | 30 days supply | Qty: 30 | Fill #1

## 2018-05-14 MED FILL — DILTIAZEM 24HR ER 180 MG CA: 180 | 30 days supply | Qty: 30 | Fill #2

## 2018-05-16 ENCOUNTER — Encounter: Payer: Self-pay | Admitting: Cardiology

## 2018-05-16 ENCOUNTER — Ambulatory Visit: Payer: 59 | Admitting: Cardiology

## 2018-05-16 VITALS — BP 142/86 | HR 110 | Ht 64.0 in | Wt 137.6 lb

## 2018-05-16 DIAGNOSIS — R079 Chest pain, unspecified: Secondary | ICD-10-CM

## 2018-05-16 DIAGNOSIS — R000 Tachycardia, unspecified: Secondary | ICD-10-CM | POA: Diagnosis not present

## 2018-05-16 MED ORDER — DILTIAZEM HCL ER COATED BEADS 240 MG PO CP24: 240.0000 mg | ORAL_CAPSULE | Freq: Every day | ORAL | 3 refills | Status: DC

## 2018-05-16 NOTE — Progress Notes (Addendum)
Electrophysiology Office Note   Date:  05/16/2018   ID:  Karla Hughes, DOB 04-12-1996, MRN [id number]  PCP:  Karla Pheasant, MD  Cardiologist:  Karla Hughes:  Karla Haydu Karla Leeds, MD    Chief Complaint  Patient presents with  . Follow-up    Inappropiate ST     History of Present Illness: Karla Hughes is a 22 y.o. female who is being seen today for the evaluation of sinus tachycardia at the request of Karla Hughes. Presenting today for electrophysiology evaluation.  She was initially seen on 03/22/2017 with palpitations, hot flashes, nausea, headaches.  Episode occurred 2-3 times a day lasting 5 to 10 minutes with and without exertion.  Her apple watch revealed heart rates in the 170s with light exercise in the low 100s at rest.  She typically plays sports without issue.  24-hour monitor showed sinus rhythm with a mean heart rate of 101 with episodes of sinus tachycardia at 133 bpm.  Echo 04/17/2017 showed normal LV systolic function with an EF of 50% and mild tricuspid regurgitation.  Today, denies symptoms of palpitations, chest pain, shortness of breath, orthopnea, PND, lower extremity edema, claudication, dizziness, presyncope, syncope, bleeding, or neurologic sequela. The patient is tolerating medications without difficulties.  Her palpitations have been improved since last being seen.  She is now having some chest discomfort.  The chest discomfort occurs when she is at rest mainly.  It lasts for up to an hour.  It happens a few times a week.  There are no exacerbating or alleviating factors.  Palpation of her chest does not reproduce the discomfort.   Past Medical History:  Diagnosis Date  . Allergy   . Headache    Past Surgical History:  Procedure Laterality Date  . MYRINGOTOMY WITH TUBE PLACEMENT     x 2     Current Outpatient Medications  Medication Sig Dispense Refill  . diltiazem (CARDIZEM CD) 180 MG 24 hr capsule Take 1 capsule  (180 mg total) by mouth daily. 30 capsule 3  . imipramine (TOFRANIL) 10 MG tablet One or two at bedtime 180 tablet 3   No current facility-administered medications for this visit.     Allergies:   Patient has no known allergies.   Social History:  The patient  reports that she has never smoked. She has never used smokeless tobacco. She reports that she does not drink alcohol or use drugs.   Family History:  The patient's family history includes Breast cancer in her unknown relative; Heart disease in her unknown relative; Multiple sclerosis in her maternal uncle, mother, and unknown relative.   ROS:  Please see the history of present illness.   Otherwise, review of systems is positive for chest pressure, palpitations.   All other systems are reviewed and negative.   PHYSICAL EXAM: VS:  BP (!) 142/86   Pulse (!) 110   Ht 5\' 4"  (1.626 m)   Wt 137 lb 9.6 oz (62.4 kg)   SpO2 100%   BMI 23.62 kg/m  , BMI Body mass index is 23.62 kg/m. GEN: Well nourished, well developed, in no acute distress  HEENT: normal  Neck: no JVD, carotid bruits, or masses Cardiac: RRR; no murmurs, rubs, or gallops,no edema  Respiratory:  clear to auscultation bilaterally, normal work of breathing GI: soft, nontender, nondistended, + BS MS: no deformity or atrophy  Skin: warm and dry Neuro:  Strength and sensation are intact Psych: euthymic mood, full affect  EKG:  EKG is ordered today. Personal review of the ekg ordered shows sinus rhythm, rate 110  Recent Labs: No results found for requested labs within last 8760 hours.    Lipid Panel  No results found for: CHOL, TRIG, HDL, CHOLHDL, VLDL, LDLCALC, LDLDIRECT   Wt Readings from Last 3 Encounters:  05/16/18 137 lb 9.6 oz (62.4 kg)  03/18/18 135 lb 9.6 oz (61.5 kg)  08/27/17 133 lb 8 oz (60.6 kg) (60 %, Z= 0.26)*   * Growth percentiles are based on CDC (Girls, 2-20 Years) data.      Other studies Reviewed: Additional studies/ records that were  reviewed today include: TTE 04/18/17  Review of the above records today demonstrates:  NORMAL LEFT VENTRICULAR SYSTOLIC FUNCTION NORMAL RIGHT VENTRICULAR SYSTOLIC FUNCTION MILD VALVULAR REGURGITATION (See above) NO VALVULAR STENOSIS EF 50%   ASSESSMENT AND PLAN:  1.  Sinus tachycardia: Currently on diltiazem 180 mg.  She is having improvement in her symptoms.  She is continuing to have high heart rates and a few episodes of palpitations.  We Karla Hughes increase her diltiazem to 240 mg.  2.  Chest pain: Pain is quite atypical and occurs mainly when she is at rest.  It is lasted for up to an hour but most of the time lasts a few minutes.  Happens a few times a week.  This may be improved with increasing her dose of diltiazem.  Should it not improve, would order an exercise treadmill test.   Current medicines are reviewed at length with the patient today.   The patient does not have concerns regarding her medicines.  The following changes were made today: Increase diltiazem  Labs/ tests ordered today include:  Orders Placed This Encounter  Procedures  . EKG 12-Lead     Disposition:   FU with Karla Hughes 3 months  Signed, Karla Wiederhold Karla Leeds, MD  05/16/2018 11:58 AM     [facility] [address] [place] [address] [phone] (office) [phone] (fax)

## 2018-05-16 NOTE — Patient Instructions (Addendum)
Medication Instructions:  Your physician has recommended you make the following change in your medication:  1. INCREASE Diltiazem to 240 mg once daily  Labwork: None ordered  Testing/Procedures: None ordered  Follow-Up: Your physician recommends that you schedule a follow-up appointment in: 3 months with Dr. Curt Bears.   * If you need a refill on your cardiac medications before your next appointment, please call your pharmacy.   *Please note that any paperwork needing to be filled out by the provider will need to be addressed at the front desk prior to seeing the provider. Please note that any FMLA, disability or other documents regarding health condition is subject to a $25.00 charge that must be received prior to completion of paperwork in the form of a money order or check.  Thank you for choosing [facility]!!   Trinidad Curet, RN [phone]  Diltiazem tablets What is this medicine? DILTIAZEM (dil TYE a zem) is a calcium-channel blocker. It affects the amount of calcium found in your heart and muscle cells. This relaxes your blood vessels, which can reduce the amount of work the heart has to do. This medicine is used to treat chest pain caused by angina. This medicine may be used for other purposes; ask your health care provider or pharmacist if you have questions. COMMON BRAND NAME(S): Cardizem What should I tell my health care provider before I take this medicine? They need to know if you have any of these conditions: -heart problems, low blood pressure, irregular heartbeat -liver disease -previous heart attack -an unusual or allergic reaction to diltiazem, other medicines, foods, dyes, or preservatives -pregnant or trying to get pregnant -breast-feeding How should I use this medicine? Take this medicine by mouth with a glass of water. Follow the directions on the prescription label. Do not cut, crush or chew this medicine. This medicine is usually taken before meals  and at bedtime. Take your doses at regular intervals. Do not take your medicine more often then directed. Do not stop taking except on the advice of your doctor or health care professional. Talk to your pediatrician regarding the use of this medicine in children. Special care may be needed. Overdosage: If you think you have taken too much of this medicine contact a poison control center or emergency room at once. NOTE: This medicine is only for you. Do not share this medicine with others. What if I miss a dose? If you miss a dose, take it as soon as you can. If it is almost time for your next dose, take only that dose. Do not take double or extra doses. What may interact with this medicine? Do not take this medicine with any of the following: -cisapride -hawthorn -pimozide -ranolazine -red yeast rice This medicine may also interact with the following medications: -buspirone -carbamazepine -cimetidine -cyclosporine -digoxin -local anesthetics or general anesthetics -lovastatin -medicines for anxiety or difficulty sleeping like midazolam and triazolam -medicines for high blood pressure or heart problems -quinidine -rifampin, rifabutin, or rifapentine This list may not describe all possible interactions. Give your health care provider a list of all the medicines, herbs, non-prescription drugs, or dietary supplements you use. Also tell them if you smoke, drink alcohol, or use illegal drugs. Some items may interact with your medicine. What should I watch for while using this medicine? Check your blood pressure and pulse rate regularly. Ask your doctor or health care professional what your blood pressure and pulse rate should be and when you should contact him or her.  You may feel dizzy or lightheaded. Do not drive, use machinery, or do anything that needs mental alertness until you know how this medicine affects you. To reduce the risk of dizzy or fainting spells, do not sit or stand up  quickly, especially if you are an older patient. Alcohol can make you more dizzy or increase flushing and rapid heartbeats. Avoid alcoholic drinks. What side effects may I notice from receiving this medicine? Side effects that you should report to your doctor or health care professional as soon as possible: -allergic reactions like skin rash, itching or hives, swelling of the face, lips, or tongue -confusion, mental depression -feeling faint or lightheaded, falls -pinpoint red spots on the skin -redness, blistering, peeling or loosening of the skin, including inside the mouth -slow, irregular heartbeat -swelling of the ankles, feet -unusual bleeding or bruising Side effects that usually do not require medical attention (report to your doctor or health care professional if they continue or are bothersome): -change in sex drive or performance -constipation or diarrhea -flushing of the face -headache -nausea, vomiting -tired or weak -trouble sleeping This list may not describe all possible side effects. Call your doctor for medical advice about side effects. You may report side effects to FDA at 1-800-FDA-1088. Where should I keep my medicine? Keep out of the reach of children. Store at room temperature between 20 and 25 degrees C (68 and 77 degrees F). Protect from light. Keep container tightly closed. Throw away any unused medicine after the expiration date. NOTE: This sheet is a summary. It may not cover all possible information. If you have questions about this medicine, talk to your doctor, pharmacist, or health care provider.  2018 Elsevier/Gold Standard (2013-10-26 10:54:31)

## 2018-06-18 ENCOUNTER — Ambulatory Visit: Payer: 59 | Admitting: Cardiology

## 2018-07-21 MED FILL — CARTIA XT 240 MG CAPSULE SA: 240 | 30 days supply | Qty: 30 | Fill #0

## 2018-08-01 ENCOUNTER — Encounter: Payer: Self-pay | Admitting: Cardiology

## 2018-08-15 ENCOUNTER — Ambulatory Visit: Payer: 59 | Admitting: Cardiology

## 2018-09-03 ENCOUNTER — Other Ambulatory Visit: Payer: Self-pay | Admitting: Cardiology

## 2018-09-03 MED FILL — CARTIA XT 240 MG CAPSULE SA: 240 | 30 days supply | Qty: 30 | Fill #0 | Status: TO

## 2018-09-16 ENCOUNTER — Ambulatory Visit (INDEPENDENT_AMBULATORY_CARE_PROVIDER_SITE_OTHER): Payer: 59

## 2018-09-16 DIAGNOSIS — Z111 Encounter for screening for respiratory tuberculosis: Secondary | ICD-10-CM

## 2018-09-16 DIAGNOSIS — Z23 Encounter for immunization: Secondary | ICD-10-CM | POA: Diagnosis not present

## 2018-09-18 ENCOUNTER — Ambulatory Visit: Payer: 59

## 2018-09-18 DIAGNOSIS — Z111 Encounter for screening for respiratory tuberculosis: Secondary | ICD-10-CM

## 2018-09-18 LAB — TB SKIN TEST
Induration: 0 mm
TB Skin Test: NEGATIVE

## 2018-09-18 NOTE — Progress Notes (Addendum)
Pt was seen today for PPD read. LEFT arm was screened and was negative for TB.   Done at 3:50 PM 09/18/2018.   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 Plus gave pt her [facility] database shot records and er shot records within our [facility] sytem.   Reviewed.  Dr Nicki Reaper

## 2018-09-29 ENCOUNTER — Ambulatory Visit (INDEPENDENT_AMBULATORY_CARE_PROVIDER_SITE_OTHER): Payer: 59

## 2018-09-29 DIAGNOSIS — Z111 Encounter for screening for respiratory tuberculosis: Secondary | ICD-10-CM

## 2018-09-29 NOTE — Progress Notes (Addendum)
Patient presented for PPD to right forearm, patient voiced no concerns nor showed any signs of distress during injection.  This is the 2nd of the two step PPD test  Reviewed.  Dr Nicki Reaper

## 2018-10-01 ENCOUNTER — Ambulatory Visit: Payer: 59 | Admitting: Lab

## 2018-10-01 DIAGNOSIS — Z111 Encounter for screening for respiratory tuberculosis: Secondary | ICD-10-CM

## 2018-10-01 LAB — TB SKIN TEST
INDURATION: 0 mm
TB SKIN TEST: NEGATIVE

## 2018-10-01 NOTE — Progress Notes (Signed)
Pt here for Tb reading negative.

## 2018-10-07 ENCOUNTER — Telehealth: Payer: Self-pay

## 2018-10-07 NOTE — Telephone Encounter (Signed)
Copied from [place] [id number]. Topic: General - Other >> Oct 07, 2018 11:32 AM Judyann Munson wrote: Reason for CRM:  Patient is  calling to request a call back for order for a Tdap  shot for employment. Please advise

## 2018-10-07 NOTE — Telephone Encounter (Signed)
Ok for tetanus booster.  She can get wherever she prefers.

## 2018-10-07 NOTE — Telephone Encounter (Signed)
Pt scheduled  

## 2018-10-07 NOTE — Telephone Encounter (Signed)
Ok to schedule her for a Tdap or give her a rx?

## 2018-10-08 ENCOUNTER — Ambulatory Visit (INDEPENDENT_AMBULATORY_CARE_PROVIDER_SITE_OTHER): Payer: 59 | Admitting: *Deleted

## 2018-10-08 DIAGNOSIS — Z23 Encounter for immunization: Secondary | ICD-10-CM

## 2018-10-08 MED ORDER — TETANUS-DIPHTH-ACELL PERTUSSIS 5-2.5-18.5 LF-MCG/0.5 IM SUSP
0.5000 mL | Freq: Once | INTRAMUSCULAR | Status: AC
  Administered 2018-10-08: 0.5 mL via INTRAMUSCULAR

## 2018-10-22 ENCOUNTER — Other Ambulatory Visit: Payer: Self-pay | Admitting: Cardiology

## 2018-10-22 MED FILL — CARTIA XT 240 MG CAPSULE SA: 240 | 15 days supply | Qty: 15 | Fill #1 | Status: TO

## 2018-10-22 NOTE — Telephone Encounter (Signed)
Outpatient Medication Detail    Disp Refills Start End   CARTIA XT 240 MG 24 hr capsule 45 capsule 6 09/03/2018    Sig: TAKE 1 CAPSULE BY MOUTH ONCE DAILY   Sent to pharmacy as: CARTIA XT 240 MG 24 hr capsule   E-Prescribing Status: Receipt confirmed by pharmacy (09/03/2018 4:47 PM EDT)   [place], [place]

## 2019-05-06 ENCOUNTER — Telehealth: Payer: Self-pay | Admitting: Internal Medicine

## 2019-05-06 NOTE — Telephone Encounter (Signed)
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

## 2019-05-08 NOTE — Telephone Encounter (Signed)
Pt scheduled  

## 2019-05-18 ENCOUNTER — Other Ambulatory Visit: Payer: Self-pay

## 2019-05-18 ENCOUNTER — Encounter: Payer: Self-pay | Admitting: Internal Medicine

## 2019-05-18 ENCOUNTER — Ambulatory Visit (INDEPENDENT_AMBULATORY_CARE_PROVIDER_SITE_OTHER): Payer: 59 | Admitting: Internal Medicine

## 2019-05-18 DIAGNOSIS — R000 Tachycardia, unspecified: Secondary | ICD-10-CM | POA: Diagnosis not present

## 2019-05-18 DIAGNOSIS — Z0289 Encounter for other administrative examinations: Secondary | ICD-10-CM

## 2019-05-18 DIAGNOSIS — R51 Headache: Secondary | ICD-10-CM | POA: Diagnosis not present

## 2019-05-18 DIAGNOSIS — R519 Headache, unspecified: Secondary | ICD-10-CM

## 2019-05-18 NOTE — Progress Notes (Signed)
Patient ID: Karla Hughes, female   DOB: 07-13-1996, 23 y.o.   MRN: [id number]   Virtual Visit via video Note  This visit type was conducted due to national recommendations for restrictions regarding the COVID-19 pandemic (e.g. social distancing).  This format is felt to be most appropriate for this patient at this time.  All issues noted in this document were discussed and addressed.  No physical exam was performed (except for noted visual exam findings with Video Visits).   I connected with Karla Hughes by a video enabled telemedicine application or telephone and verified that I am speaking with the correct person using two identifiers. Location patient: home Location provider: work Persons participating in the virtual visit: patient, provider  I discussed the limitations, risks, security and privacy concerns of performing an evaluation and management service by video and the availability of in person appointments. The patient expressed understanding and agreed to proceed.   Reason for visit: follow up appt   HPI: She reports she is doing well.  Previously saw cardiology/EP for evaluation of sinus tachycardia.  ECHO - normal LV function, mild TR.  holter - intermittent ST.  Was placed on diltiazem.  Instructed to increase dose to 240mg .  She is off medication now.  No significant problems with increased heart rate or palpitations.  No acid reflux.  No abdominal pain.  Bowels moving.  Saw Dr Felecia Shelling for headaches.  Was prescribed imipramine.  Not taking.  States headaches - doing well.  Better off caffeine.  Staying hydrated.  Periods regular.  LMP - now.  Back at boot camp.  Classes outside.  Overall feels good.  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     ROS: See pertinent positives and negatives per HPI.  Past Medical History:  Diagnosis Date  . Allergy   . Headache     Past Surgical History:  Procedure Laterality Date  . MYRINGOTOMY WITH TUBE PLACEMENT     x 2     Family History  Problem Relation Age of Onset  . Multiple sclerosis Mother   . Breast cancer Other        grandmother  . Multiple sclerosis Other        mother  . Heart disease Other        grandparent  . Multiple sclerosis Maternal Uncle     SOCIAL HX: reviewed.   No current outpatient medications on file.  EXAM:  GENERAL: alert, oriented, appears well and in no acute distress  HEENT: atraumatic, conjunttiva clear, no obvious abnormalities on inspection of external nose and ears  NECK: normal movements of the head and neck  LUNGS: on inspection no signs of respiratory distress, breathing rate appears normal, no obvious gross SOB, gasping or wheezing  CV: no obvious cyanosis  PSYCH/NEURO: pleasant and cooperative, no obvious depression or anxiety, speech and thought processing grossly intact  ASSESSMENT AND PLAN:  Discussed the following assessment and plan:  Headache Has seen Dr Felecia Shelling.  Headaches - not a significant issue for her now.  Follow.    Tachycardia Has seen cardiology and EP.  ECHO and holter as outlined.  Not taking diltiazem now.  Doing well.  Follow.    Encounter for completion of form with patient Form completed.      I discussed the assessment and treatment plan with the patient. The patient was provided an opportunity to ask questions and all were answered. The patient agreed with the plan and demonstrated an understanding  of the instructions.   The patient was advised to call back or seek an in-person evaluation if the symptoms worsen or if the condition fails to improve as anticipated.   Stran Raper, MD  

## 2019-05-22 ENCOUNTER — Telehealth: Payer: Self-pay | Admitting: Internal Medicine

## 2019-05-22 NOTE — Telephone Encounter (Signed)
Advised that I still needed pulse. Checked while I was on the phone. P-67. Added to forms. Advised they could pick up next week

## 2019-05-22 NOTE — Telephone Encounter (Signed)
Pt called with vitals requested by PCP. Please advise.   BP: 118/72 Respirations: 12 Height: 5'4  Weight: 139 Temp: 98.1

## 2019-05-23 ENCOUNTER — Encounter: Payer: Self-pay | Admitting: Internal Medicine

## 2019-05-23 DIAGNOSIS — Z0289 Encounter for other administrative examinations: Secondary | ICD-10-CM | POA: Insufficient documentation

## 2019-05-23 NOTE — Assessment & Plan Note (Signed)
Has seen Dr Felecia Shelling.  Headaches - not a significant issue for her now.  Follow.

## 2019-05-23 NOTE — Assessment & Plan Note (Signed)
Has seen cardiology and EP.  ECHO and holter as outlined.  Not taking diltiazem now.  Doing well.  Follow.

## 2019-05-23 NOTE — Assessment & Plan Note (Signed)
Form completed.

## 2019-07-08 ENCOUNTER — Ambulatory Visit: Payer: 59 | Admitting: Internal Medicine

## 2019-08-07 ENCOUNTER — Telehealth: Payer: Self-pay

## 2019-08-07 NOTE — Telephone Encounter (Signed)
Please schedule her for a NV for TB skin test. She will have to come back here to have it read 48-72 hrs after the test is placed

## 2019-08-07 NOTE — Telephone Encounter (Signed)
Called and scheduled pt

## 2019-08-07 NOTE — Telephone Encounter (Signed)
Copied from [place] [id number]. 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  Pt needs to be tested again and would like to know when she can do that. Pt can be reached at [phone]

## 2019-08-18 ENCOUNTER — Other Ambulatory Visit: Payer: Self-pay

## 2019-08-18 ENCOUNTER — Ambulatory Visit (INDEPENDENT_AMBULATORY_CARE_PROVIDER_SITE_OTHER): Payer: 59 | Admitting: *Deleted

## 2019-08-18 DIAGNOSIS — Z111 Encounter for screening for respiratory tuberculosis: Secondary | ICD-10-CM | POA: Diagnosis not present

## 2019-08-20 ENCOUNTER — Other Ambulatory Visit: Payer: Self-pay

## 2019-08-20 ENCOUNTER — Ambulatory Visit: Payer: 59

## 2019-08-20 DIAGNOSIS — Z111 Encounter for screening for respiratory tuberculosis: Secondary | ICD-10-CM

## 2019-08-20 LAB — TB SKIN TEST
Induration: 0 mm
TB Skin Test: NEGATIVE

## 2019-08-20 NOTE — Progress Notes (Signed)
Patient presented for PPD reading of right arm.  Results read by Garnette Scheuermann, LPN.

## 2019-09-01 ENCOUNTER — Other Ambulatory Visit: Payer: Self-pay

## 2019-09-01 ENCOUNTER — Ambulatory Visit (INDEPENDENT_AMBULATORY_CARE_PROVIDER_SITE_OTHER): Payer: 59

## 2019-09-01 DIAGNOSIS — Z111 Encounter for screening for respiratory tuberculosis: Secondary | ICD-10-CM

## 2019-09-01 NOTE — Progress Notes (Signed)
Karla Hughes presents today for injection per MD orders. PPD Placement administered SQ in left fFore Arm. Administration without incident. Patient tolerated well.   Palmina Clodfelter,cma

## 2019-09-03 ENCOUNTER — Other Ambulatory Visit: Payer: Self-pay

## 2019-09-03 ENCOUNTER — Ambulatory Visit: Payer: 59

## 2019-09-03 DIAGNOSIS — Z111 Encounter for screening for respiratory tuberculosis: Secondary | ICD-10-CM

## 2019-09-03 LAB — TB SKIN TEST
Induration: 0 mm
TB Skin Test: NEGATIVE

## 2019-09-03 NOTE — Progress Notes (Signed)
Patient presented today for PPD read.  Results were negative.

## 2019-09-14 ENCOUNTER — Ambulatory Visit: Payer: 59 | Admitting: Cardiology

## 2019-09-14 ENCOUNTER — Encounter: Payer: Self-pay | Admitting: Cardiology

## 2019-09-14 ENCOUNTER — Other Ambulatory Visit: Payer: Self-pay

## 2019-09-14 VITALS — BP 128/72 | HR 104 | Ht 64.0 in | Wt 134.2 lb

## 2019-09-14 DIAGNOSIS — R000 Tachycardia, unspecified: Secondary | ICD-10-CM

## 2019-09-14 MED ORDER — DILTIAZEM HCL ER COATED BEADS 180 MG PO CP24: 180.0000 mg | ORAL_CAPSULE | Freq: Every day | ORAL | 1 refills | Status: DC

## 2019-09-14 NOTE — Progress Notes (Signed)
Electrophysiology Office Note   Date:  09/14/2019   ID:  Karla Hughes, DOB 06-26-1996, MRN [id number]  PCP:  Einar Pheasant, MD  Cardiologist:  [place] Primary Electrophysiologist:  Amit Meloy Meredith Leeds, MD    No chief complaint on file.    History of Present Illness: Karla Hughes is a 23 y.o. female who is being seen today for the evaluation of sinus tachycardia at the request of Isaias Cowman. Presenting today for electrophysiology evaluation.  She was initially seen on 03/22/2017 with palpitations, hot flashes, nausea, headaches.  Episode occurred 2-3 times a day lasting 5 to 10 minutes with and without exertion.  Her apple watch revealed heart rates in the 170s with light exercise in the low 100s at rest.  She typically plays sports without issue.  24-hour monitor showed sinus rhythm with a mean heart rate of 101 with episodes of sinus tachycardia at 133 bpm.  Echo 04/17/2017 showed normal LV systolic function with an EF of 50% and mild tricuspid regurgitation.  Today, denies symptoms of  chest pain, shortness of breath, orthopnea, PND, lower extremity edema, claudication, dizziness, presyncope, syncope, bleeding, or neurologic sequela. The patient is tolerating medications without difficulties.  She has been having palpitations that started this past summer.  She started a boot camp class and noted that her heart rate would get into the 200s.  This was noted on her apple watch.  She also saw sparkling in her eyes during these times.  Otherwise she has felt well.  She does note some right-sided chest discomfort when she takes a deep breath.   Past Medical History:  Diagnosis Date  . Allergy   . Headache    Past Surgical History:  Procedure Laterality Date  . MYRINGOTOMY WITH TUBE PLACEMENT     x 2     No current outpatient medications on file.   No current facility-administered medications for this visit.     Allergies:   Patient has no known allergies.    Social History:  The patient  reports that she has never smoked. She has never used smokeless tobacco. She reports that she does not drink alcohol or use drugs.   Family History:  The patient's family history includes Breast cancer in an other family member; Heart disease in an other family member; Multiple sclerosis in her maternal uncle, mother, and another family member.   ROS:  Please see the history of present illness.   Otherwise, review of systems is positive for none.   All other systems are reviewed and negative.   PHYSICAL EXAM: VS:  BP 128/72   Pulse (!) 104   Ht 5\' 4"  (1.626 m)   Wt 134 lb 3.2 oz (60.9 kg)   SpO2 99%   BMI 23.04 kg/m  , BMI Body mass index is 23.04 kg/m. GEN: Well nourished, well developed, in no acute distress  HEENT: normal  Neck: no JVD, carotid bruits, or masses Cardiac: RRR; no murmurs, rubs, or gallops,no edema  Respiratory:  clear to auscultation bilaterally, normal work of breathing GI: soft, nontender, nondistended, + BS MS: no deformity or atrophy  Skin: warm and dry Neuro:  Strength and sensation are intact Psych: euthymic mood, full affect  EKG:  EKG is ordered today. Personal review of the ekg ordered shows sinus tachycardia, rate 104  Recent Labs: No results found for requested labs within last 8760 hours.    Lipid Panel  No results found for: CHOL, TRIG, HDL, CHOLHDL, VLDL, LDLCALC,  LDLDIRECT   Wt Readings from Last 3 Encounters:  09/14/19 134 lb 3.2 oz (60.9 kg)  05/16/18 137 lb 9.6 oz (62.4 kg)  03/18/18 135 lb 9.6 oz (61.5 kg)      Other studies Reviewed: Additional studies/ records that were reviewed today include: TTE 04/18/17  Review of the above records today demonstrates:  NORMAL LEFT VENTRICULAR SYSTOLIC FUNCTION NORMAL RIGHT VENTRICULAR SYSTOLIC FUNCTION MILD VALVULAR REGURGITATION (See above) NO VALVULAR STENOSIS EF 50%   ASSESSMENT AND PLAN:  1.  Sinus tachycardia: She had previously been off of her  diltiazem and was doing well.  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  We Wynn Alldredge thus restart diltiazem.  2.  Chest pain: *Pain is quite atypical for cardiac chest pain.  It is worse with taking a deep breath.  Pressed on her upper right chest close to her right shoulder which reproduce the pain.  I have told her that she can try naproxen to see if this [place] help.   Current medicines are reviewed at length with the patient today.   The patient does not have concerns regarding her medicines.  The following changes were made today: Start diltiazem  Labs/ tests ordered today include:  Orders Placed This Encounter  Procedures  . EKG 12-Lead     Disposition:   FU with Lynnleigh Soden 3 months  Signed, Al Gagen Meredith Leeds, MD  09/14/2019 4:25 PM     [place] [address] [phone] (office) [phone] (fax)

## 2019-10-09 DIAGNOSIS — H5213 Myopia, bilateral: Secondary | ICD-10-CM | POA: Diagnosis not present

## 2019-12-17 ENCOUNTER — Other Ambulatory Visit: Payer: Self-pay

## 2019-12-17 ENCOUNTER — Encounter: Payer: Self-pay | Admitting: Cardiology

## 2019-12-17 ENCOUNTER — Ambulatory Visit: Payer: 59 | Admitting: Cardiology

## 2019-12-17 VITALS — BP 122/84 | HR 105 | Ht 64.0 in | Wt 136.4 lb

## 2019-12-17 DIAGNOSIS — R000 Tachycardia, unspecified: Secondary | ICD-10-CM | POA: Diagnosis not present

## 2019-12-17 MED ORDER — DILTIAZEM HCL ER COATED BEADS 240 MG PO CP24: 240.0000 mg | ORAL_CAPSULE | Freq: Every day | ORAL | 2 refills | Status: DC

## 2019-12-17 NOTE — Patient Instructions (Signed)
Medication Instructions:  Your physician has recommended you make the following change in your medication:  1. INCREASE Diltiazem to 240 mg once daily  * If you need a refill on your cardiac medications before your next appointment, please call your pharmacy.   Labwork: None ordered If you have labs (blood work) drawn today and your tests are completely normal, you will receive your results only by:  [place] (if you have MyChart) OR  A paper copy in the mail If you have any lab test that is abnormal or we need to change your treatment, we will call you to review the results.  Testing/Procedures: None ordered  Follow-Up: At [facility], you and your health needs are our priority.  As part of our continuing mission to provide you with exceptional heart care, we have created designated Provider Care Teams.  These Care Teams include your primary Cardiologist (physician) and Advanced Practice Providers (APPs -  Physician Assistants and Nurse Practitioners) who all work together to provide you with the care you need, when you need it.  You will need a follow up appointment in 6 months.  Please call our office 2 months in advance to schedule this appointment.  You may see Dr Curt Bears or one of the following Advanced Practice Providers on your designated Care Team:    Chanetta Marshall, NP  Tommye Standard, PA-C  Oda Kilts, [place]  Thank you for choosing [facility]!!   Trinidad Curet, RN [phone]  Any Other Special Instructions Will Be Listed Below (If Applicable).

## 2019-12-17 NOTE — Progress Notes (Signed)
Electrophysiology Office Note   Date:  12/17/2019   ID:  Karla Hughes, DOB 1996-04-06, MRN [id number]  PCP:  Einar Pheasant, MD  Cardiologist:  [place] Primary Electrophysiologist:  Zonya Gudger Meredith Leeds, MD    No chief complaint on file.    History of Present Illness: Karla Hughes is a 24 y.o. female who is being seen today for the evaluation of sinus tachycardia at the request of Isaias Cowman. Presenting today for electrophysiology evaluation.  She was initially seen on 03/22/2017 with palpitations, hot flashes, nausea, headaches.  Episode occurred 2-3 times a day lasting 5 to 10 minutes with and without exertion.  Her apple watch revealed heart rates in the 170s with light exercise in the low 100s at rest.  She typically plays sports without issue.  24-hour monitor showed sinus rhythm with a mean heart rate of 101 with episodes of sinus tachycardia at 133 bpm.  Echo 04/17/2017 showed normal LV systolic function with an EF of 50% and mild tricuspid regurgitation.  Today, denies symptoms of palpitations, chest pain, shortness of breath, orthopnea, PND, lower extremity edema, claudication, dizziness, presyncope, syncope, bleeding, or neurologic sequela. The patient is tolerating medications without difficulties. She is overall feeling well. She does continue to have short episodes of palpitations that occur when she exercises though. She is able to do most of her daily activities, but does have some limitations with exercise.   Past Medical History:  Diagnosis Date  . Allergy   . Headache    Past Surgical History:  Procedure Laterality Date  . MYRINGOTOMY WITH TUBE PLACEMENT     x 2     Current Outpatient Medications  Medication Sig Dispense Refill  . diltiazem (CARDIZEM CD) 180 MG 24 hr capsule Take 1 capsule (180 mg total) by mouth daily. 90 capsule 1   No current facility-administered medications for this visit.    Allergies:   Patient has no known allergies.    Social History:  The patient  reports that she has never smoked. She has never used smokeless tobacco. She reports that she does not drink alcohol or use drugs.   Family History:  The patient's family history includes Breast cancer in an other family member; Heart disease in an other family member; Multiple sclerosis in her maternal uncle, mother, and another family member.   ROS:  Please see the history of present illness.   Otherwise, review of systems is positive for none.   All other systems are reviewed and negative.   PHYSICAL EXAM: VS:  BP 122/84   Pulse (!) 105   Ht 5\' 4"  (1.626 m)   Wt 136 lb 6.4 oz (61.9 kg)   SpO2 99%   BMI 23.41 kg/m  , BMI Body mass index is 23.41 kg/m. GEN: Well nourished, well developed, in no acute distress  HEENT: normal  Neck: no JVD, carotid bruits, or masses Cardiac: RRR; no murmurs, rubs, or gallops,no edema  Respiratory:  clear to auscultation bilaterally, normal work of breathing GI: soft, nontender, nondistended, + BS MS: no deformity or atrophy  Skin: warm and dry Neuro:  Strength and sensation are intact Psych: euthymic mood, full affect  EKG:  EKG is ordered today. Personal review of the ekg ordered shows sinus rhythm, rate 96  Recent Labs: No results found for requested labs within last 8760 hours.    Lipid Panel  No results found for: CHOL, TRIG, HDL, CHOLHDL, VLDL, LDLCALC, LDLDIRECT   Wt Readings from Last 3  Encounters:  12/17/19 136 lb 6.4 oz (61.9 kg)  09/14/19 134 lb 3.2 oz (60.9 kg)  05/16/18 137 lb 9.6 oz (62.4 kg)      Other studies Reviewed: Additional studies/ records that were reviewed today include: TTE 04/18/17  Review of the above records today demonstrates:  NORMAL LEFT VENTRICULAR SYSTOLIC FUNCTION NORMAL RIGHT VENTRICULAR SYSTOLIC FUNCTION MILD VALVULAR REGURGITATION (See above) NO VALVULAR STENOSIS EF 50%   ASSESSMENT AND PLAN:  1.  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 She is  having continued palpitations, though they are improved with 180 mg of diltiazem. We Karla Hughes thus plan to increase to 240 mg.   Current medicines are reviewed at length with the patient today.   The patient does not have concerns regarding her medicines.  The following changes were made today: Increase diltiazem  Labs/ tests ordered today include:  Orders Placed This Encounter  Procedures  . EKG 12-Lead     Disposition:   FU with Karla Hughes 6 months  Signed, Karla Jacuinde Meredith Leeds, MD  12/17/2019 3:39 PM     [facility] [address] [place] [address] [phone] (office) [phone] (fax)

## 2020-01-19 ENCOUNTER — Other Ambulatory Visit: Payer: Self-pay | Admitting: Cardiology

## 2020-01-19 MED ORDER — DILTIAZEM HCL ER COATED BEADS 240 MG PO CP24: 240.0000 mg | ORAL_CAPSULE | Freq: Every day | ORAL | 2 refills | Status: DC

## 2020-01-19 MED FILL — CARTIA XT 240 MG CAPSULE SA: 240 | 90 days supply | Qty: 90 | Fill #0

## 2020-01-19 NOTE — Telephone Encounter (Signed)
*  STAT* If patient is at the pharmacy, call can be transferred to refill team.   1. Which medications need to be refilled? (please list name of each medication and dose if known) diltiazem (CARDIZEM CD) 240 MG 24 hr capsule 2. Which pharmacy/location (including street and city if local pharmacy) is medication to be sent to? [place], [place]  3. Do they need a 30 day or 90 day supply? 90 day supply

## 2020-02-09 DIAGNOSIS — D2272 Melanocytic nevi of left lower limb, including hip: Secondary | ICD-10-CM | POA: Diagnosis not present

## 2020-02-09 DIAGNOSIS — D485 Neoplasm of uncertain behavior of skin: Secondary | ICD-10-CM | POA: Diagnosis not present

## 2020-02-09 DIAGNOSIS — Z7189 Other specified counseling: Secondary | ICD-10-CM | POA: Diagnosis not present

## 2020-02-09 DIAGNOSIS — D225 Melanocytic nevi of trunk: Secondary | ICD-10-CM | POA: Diagnosis not present

## 2020-02-09 DIAGNOSIS — D224 Melanocytic nevi of scalp and neck: Secondary | ICD-10-CM | POA: Diagnosis not present

## 2020-02-09 DIAGNOSIS — D2262 Melanocytic nevi of left upper limb, including shoulder: Secondary | ICD-10-CM | POA: Diagnosis not present

## 2020-04-20 MED FILL — DILTIAZEM 24HR ER 240 MG CA: 240 | 90 days supply | Qty: 90 | Fill #1

## 2020-06-17 ENCOUNTER — Encounter: Payer: Self-pay | Admitting: Internal Medicine

## 2020-06-20 NOTE — Telephone Encounter (Signed)
See me before calling this pt.  She sees cardiology.  Given her concern regarding receiving the vaccine secondary to her heart issue, I recommend she contact her cardiologist for recommendation.

## 2020-06-21 NOTE — Telephone Encounter (Signed)
LMTCB

## 2020-06-26 ENCOUNTER — Encounter: Payer: Self-pay | Admitting: Internal Medicine

## 2020-08-09 ENCOUNTER — Ambulatory Visit (INDEPENDENT_AMBULATORY_CARE_PROVIDER_SITE_OTHER): Payer: 59

## 2020-08-09 ENCOUNTER — Other Ambulatory Visit: Payer: Self-pay

## 2020-08-09 DIAGNOSIS — Z111 Encounter for screening for respiratory tuberculosis: Secondary | ICD-10-CM | POA: Diagnosis not present

## 2020-08-09 DIAGNOSIS — Z23 Encounter for immunization: Secondary | ICD-10-CM | POA: Diagnosis not present

## 2020-08-09 NOTE — Progress Notes (Addendum)
Patient presented for PPD injection to left forearm, patient voiced no concerns nor showed any signs of distress during injection.  Pt presented for TB skin test.  Placed.    Dr Nicki Reaper

## 2020-08-12 ENCOUNTER — Other Ambulatory Visit: Payer: Self-pay

## 2020-08-12 ENCOUNTER — Ambulatory Visit: Payer: 59 | Admitting: *Deleted

## 2020-08-12 DIAGNOSIS — Z111 Encounter for screening for respiratory tuberculosis: Secondary | ICD-10-CM

## 2020-08-12 LAB — TB SKIN TEST
Induration: 0 mm
TB Skin Test: NEGATIVE

## 2020-08-12 NOTE — Progress Notes (Signed)
PPD negative

## 2020-11-08 ENCOUNTER — Ambulatory Visit (INDEPENDENT_AMBULATORY_CARE_PROVIDER_SITE_OTHER): Payer: 59 | Admitting: Internal Medicine

## 2020-11-08 ENCOUNTER — Encounter: Payer: Self-pay | Admitting: Internal Medicine

## 2020-11-08 ENCOUNTER — Other Ambulatory Visit: Payer: Self-pay

## 2020-11-08 ENCOUNTER — Other Ambulatory Visit (HOSPITAL_COMMUNITY)
Admission: RE | Admit: 2020-11-08 | Discharge: 2020-11-08 | Disposition: A | Discharge status: Home / Self Care | Payer: 59 | Source: Ambulatory Visit | Attending: Internal Medicine | Admitting: Internal Medicine

## 2020-11-08 VITALS — BP 122/76 | HR 108 | Temp 98.8°F | Resp 16 | Ht 64.0 in | Wt 133.0 lb

## 2020-11-08 DIAGNOSIS — Z9109 Other allergy status, other than to drugs and biological substances: Secondary | ICD-10-CM

## 2020-11-08 DIAGNOSIS — Z0000 Encounter for general adult medical examination without abnormal findings: Secondary | ICD-10-CM

## 2020-11-08 DIAGNOSIS — Z3009 Encounter for other general counseling and advice on contraception: Secondary | ICD-10-CM | POA: Diagnosis not present

## 2020-11-08 DIAGNOSIS — Z124 Encounter for screening for malignant neoplasm of cervix: Secondary | ICD-10-CM | POA: Insufficient documentation

## 2020-11-08 DIAGNOSIS — Z13220 Encounter for screening for lipoid disorders: Secondary | ICD-10-CM | POA: Diagnosis not present

## 2020-11-08 DIAGNOSIS — R000 Tachycardia, unspecified: Secondary | ICD-10-CM

## 2020-11-08 MED ORDER — NORETHIN ACE-ETH ESTRAD-FE 1-20 MG-MCG PO TABS: 1.0000 | ORAL_TABLET | Freq: Every day | ORAL | 11 refills | Status: DC

## 2020-11-08 NOTE — Assessment & Plan Note (Signed)
Physical today 11/08/20.  PAP 11/08/20.

## 2020-11-08 NOTE — Progress Notes (Signed)
Patient ID: Karla Hughes, female   DOB: Jun 05, 1996, 24 y.o.   MRN: [id number]   Subjective:    Patient ID: Karla Hughes, female    DOB: January 20, 1996, 24 y.o.   MRN: [id number]  HPI This visit occurred during the SARS-CoV-2 public health emergency.  Safety protocols were in place, including screening questions prior to the visit, additional usage of staff PPE, and extensive cleaning of exam room while observing appropriate contact time as indicated for disinfecting solutions.  Patient here for physical exam.  She reports she is doing well.  Feels good.  Tries to stay active.  No chest pain or sob reported.  No abdominal pain or bowel change reported.  Saw cardiology. Diagnosed with sinus tachycardia.  Placed on diltiazem.  Increased to 240mg  11/2019.  Has done well on this dose of medication.  Due f/u.  Eating and drinking well.      Past Medical History:  Diagnosis Date  . Allergy   . Headache    Past Surgical History:  Procedure Laterality Date  . MYRINGOTOMY WITH TUBE PLACEMENT     x 2   Family History  Problem Relation Age of Onset  . Multiple sclerosis Mother   . Breast cancer Other        grandmother  . Multiple sclerosis Other        mother  . Heart disease Other        grandparent  . Multiple sclerosis Maternal Uncle    Social History   Socioeconomic History  . Marital status: Single    Spouse name: Not on file  . Number of children: Not on file  . Years of education: Not on file  . Highest education level: Not on file  Occupational History  . Not on file  Tobacco Use  . Smoking status: Never Smoker  . Smokeless tobacco: Never Used  Vaping Use  . Vaping Use: Never used  Substance and Sexual Activity  . Alcohol use: No    Alcohol/week: 0.0 standard drinks  . Drug use: No  . Sexual activity: Never    Birth control/protection: Abstinence  Other Topics Concern  . Not on file  Social History Narrative  . Not on file   Social Determinants of Health    Financial Resource Strain: Not on file  Food Insecurity: Not on file  Transportation Needs: Not on file  Physical Activity: Not on file  Stress: Not on file  Social Connections: Not on file    Outpatient Encounter Medications as of 11/08/2020  Medication Sig  . diltiazem (CARDIZEM CD) 240 MG 24 hr capsule Take 1 capsule (240 mg total) by mouth daily.  . norethindrone-ethinyl estradiol (LOESTRIN FE 1/20) 1-20 MG-MCG tablet Take 1 tablet by mouth daily.   No facility-administered encounter medications on file as of 11/08/2020.    Review of Systems  Constitutional: Negative for appetite change and unexpected weight change.  HENT: Negative for congestion, sinus pressure and sore throat.   Eyes: Negative for pain and visual disturbance.  Respiratory: Negative for cough, chest tightness and shortness of breath.   Cardiovascular: Negative for chest pain, palpitations and leg swelling.  Gastrointestinal: Negative for abdominal pain, diarrhea, nausea and vomiting.  Genitourinary: Negative for difficulty urinating and dysuria.  Musculoskeletal: Negative for joint swelling and myalgias.  Skin: Negative for color change and rash.  Neurological: Negative for dizziness, light-headedness and headaches.  Hematological: Negative for adenopathy. Does not bruise/bleed easily.  Psychiatric/Behavioral: Negative for agitation and decreased  concentration.       Objective:    Physical Exam Vitals reviewed.  Constitutional:      General: She is not in acute distress.    Appearance: Normal appearance. She is well-developed and well-nourished.  HENT:     Head: Normocephalic and atraumatic.     Right Ear: External ear normal.     Left Ear: External ear normal.     Mouth/Throat:     Mouth: Oropharynx is clear and moist.  Eyes:     General: No scleral icterus.       Right eye: No discharge.        Left eye: No discharge.     Conjunctiva/sclera: Conjunctivae normal.  Neck:     Thyroid: No  thyromegaly.  Cardiovascular:     Rate and Rhythm: Normal rate and regular rhythm.  Pulmonary:     Effort: No tachypnea, accessory muscle usage or respiratory distress.     Breath sounds: Normal breath sounds. No decreased breath sounds or wheezing.  Chest:  Breasts:     Right: No inverted nipple, mass, nipple discharge or tenderness (no axillary adenopathy).     Left: No inverted nipple, mass, nipple discharge or tenderness (no axilarry adenopathy).    Abdominal:     General: Bowel sounds are normal.     Palpations: Abdomen is soft.     Tenderness: There is no abdominal tenderness.  Genitourinary:    Comments: Normal external genitalia.  Vaginal vault without lesions.  Cervix identified.  Pap smear performed.  Could not appreciate any adnexal masses or tenderness.   Musculoskeletal:        General: No swelling, tenderness or edema.     Cervical back: Neck supple. No tenderness.  Lymphadenopathy:     Cervical: No cervical adenopathy.  Skin:    Findings: No erythema or rash.  Neurological:     Mental Status: She is alert and oriented to person, place, and time.  Psychiatric:        Mood and Affect: Mood and affect and mood normal.        Behavior: Behavior normal.     BP 122/76   Pulse (!) 108   Temp 98.8 F (37.1 C) (Oral)   Resp 16   Ht 5\' 4"  (1.626 m)   Wt 133 lb (60.3 kg)   SpO2 98%   BMI 22.83 kg/m  Wt Readings from Last 3 Encounters:  11/08/20 133 lb (60.3 kg)  12/17/19 136 lb 6.4 oz (61.9 kg)  09/14/19 134 lb 3.2 oz (60.9 kg)     Lab Results  Component Value Date   WBC 11.6 (H) 03/19/2017   HGB 15.5 03/19/2017   HCT 45.6 03/19/2017   PLT 284 03/19/2017   GLUCOSE 111 (H) 03/19/2017   ALT 12 (L) 03/19/2017   AST 22 03/19/2017   NA 139 03/19/2017   K 3.7 03/19/2017   CL 102 03/19/2017   CREATININE 0.62 03/19/2017   BUN 11 03/19/2017   CO2 28 03/19/2017   TSH 1.350 03/19/2017   HGBA1C 5.0 03/19/2017       Assessment & Plan:   Problem List  Items Addressed This Visit    Environmental allergies    No symptoms currently.  Follow.        Birth control counseling    Discussed birth control and options.  She desires to start ocp's.  Discussed possible side effects and risks of medication.  Start loestrin FE.  Follow.  Healthcare maintenance    Physical today 11/08/20.  PAP 11/08/20.       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  On diltiazem 240mg  q day.  Doing well on this medication.  Follow.         Other Visit Diagnoses    Routine general medical examination at a health care facility    -  Primary   Cervical cancer screening       Relevant Orders   Cytology - PAP( [place]) (Completed)   Screening cholesterol level       Relevant Orders   Comprehensive metabolic panel   CBC with Differential/Platelet   Lipid panel   TSH       Einar Pheasant, MD

## 2020-11-08 NOTE — Progress Notes (Signed)
4

## 2020-11-10 ENCOUNTER — Ambulatory Visit: Payer: 59 | Admitting: Podiatry

## 2020-11-10 ENCOUNTER — Other Ambulatory Visit: Payer: Self-pay

## 2020-11-10 ENCOUNTER — Encounter: Payer: Self-pay | Admitting: Podiatry

## 2020-11-10 DIAGNOSIS — L600 Ingrowing nail: Secondary | ICD-10-CM

## 2020-11-10 MED ORDER — SULFAMETHOXAZOLE-TRIMETHOPRIM 800-160 MG PO TABS: 1.0000 | ORAL_TABLET | Freq: Two times a day (BID) | ORAL | 0 refills | Status: DC

## 2020-11-10 NOTE — Patient Instructions (Signed)

## 2020-11-10 NOTE — Progress Notes (Signed)
  Subjective:  Patient ID: Karla Hughes, female    DOB: [birth year],  MRN: [id number]  Chief Complaint  Patient presents with  . Nail Problem    Patient presents today for ingrown toenail left hallux lateral border x years off and on    24 y.o. female presents with the above complaint.  Patient presents with complaint left hallux lateral nail border ingrown.  Patient has been going on for many years she has tried self debridement which may have made it worse.  There are some erythema present.  She denies any other acute complaints.  She would like to discuss to discuss treatment options she would like to have removed.  She is here with her mother today   Review of Systems: Negative except as noted in the HPI. Denies N/V/F/Ch.  Past Medical History:  Diagnosis Date  . Allergy   . Headache     Current Outpatient Medications:  .  diltiazem (CARDIZEM CD) 240 MG 24 hr capsule, Take 1 capsule (240 mg total) by mouth daily., Disp: 90 capsule, Rfl: 2 .  norethindrone-ethinyl estradiol (LOESTRIN FE 1/20) 1-20 MG-MCG tablet, Take 1 tablet by mouth daily., Disp: 28 tablet, Rfl: 11 .  sulfamethoxazole-trimethoprim (BACTRIM DS) 800-160 MG tablet, Take 1 tablet by mouth 2 (two) times daily., Disp: 20 tablet, Rfl: 0  Social History   Tobacco Use  Smoking Status Never Smoker  Smokeless Tobacco Never Used    No Known Allergies Objective:  There were no vitals filed for this visit. There is no height or weight on file to calculate BMI. Constitutional Well developed. Well nourished.  Vascular Dorsalis pedis pulses palpable bilaterally. Posterior tibial pulses palpable bilaterally. Capillary refill normal to all digits.  No cyanosis or clubbing noted. Pedal hair growth normal.  Neurologic Normal speech. Oriented to person, place, and time. Epicritic sensation to light touch grossly present bilaterally.  Dermatologic Painful ingrowing nail at lateral nail borders of the hallux nail  left. No other open wounds. No skin lesions.  Orthopedic: Normal joint ROM without pain or crepitus bilaterally. No visible deformities. No bony tenderness.   Radiographs: None Assessment:   1. Ingrown left big toenail    Plan:  Patient was evaluated and treated and all questions answered.  Ingrown Nail, left -Patient elects to proceed with minor surgery to remove ingrown toenail removal today. Consent reviewed and signed by patient. -Ingrown nail excised. See procedure note. -Educated on post-procedure care including soaking. Written instructions provided and reviewed. -Patient to follow up in 2 weeks for nail check.  Procedure: Excision of Ingrown Toenail Location: Left 1st toe lateral nail borders. Anesthesia: Lidocaine 1% plain; 1.5 mL and Marcaine 0.5% plain; 1.5 mL, digital block. Skin Prep: Betadine. Dressing: Silvadene; telfa; dry, sterile, compression dressing. Technique: Following skin prep, the toe was exsanguinated and a tourniquet was secured at the base of the toe. The affected nail border was freed, split with a nail splitter, and excised. Chemical matrixectomy was then performed with phenol and irrigated out with alcohol. The tourniquet was then removed and sterile dressing applied. Disposition: Patient tolerated procedure well. Patient to return in 2 weeks for follow-up.   No follow-ups on file.

## 2020-11-11 LAB — CYTOLOGY - PAP
Comment: NEGATIVE
Diagnosis: NEGATIVE
High risk HPV: NEGATIVE

## 2020-11-13 ENCOUNTER — Encounter: Payer: Self-pay | Admitting: Internal Medicine

## 2020-11-13 NOTE — Assessment & Plan Note (Signed)
No symptoms currently.  Follow.

## 2020-11-13 NOTE — Assessment & Plan Note (Signed)
Diagnosed by cardiology.  On diltiazem 240mg q day.  Doing well on this medication.  Follow.   °

## 2020-11-13 NOTE — Assessment & Plan Note (Signed)
Discussed birth control and options.  She desires to start ocp's.  Discussed possible side effects and risks of medication.  Start loestrin FE.  Follow.

## 2020-11-15 ENCOUNTER — Other Ambulatory Visit: Payer: Self-pay

## 2020-11-15 ENCOUNTER — Other Ambulatory Visit (INDEPENDENT_AMBULATORY_CARE_PROVIDER_SITE_OTHER): Payer: 59

## 2020-11-15 DIAGNOSIS — Z13220 Encounter for screening for lipoid disorders: Secondary | ICD-10-CM

## 2020-11-15 LAB — CBC WITH DIFFERENTIAL/PLATELET
Basophils Absolute: 0 10*3/uL (ref 0.0–0.1)
Basophils Relative: 0.5 % (ref 0.0–3.0)
Eosinophils Absolute: 0 10*3/uL (ref 0.0–0.7)
Eosinophils Relative: 0.6 % (ref 0.0–5.0)
HCT: 41.1 % (ref 36.0–46.0)
Hemoglobin: 14 g/dL (ref 12.0–15.0)
Lymphocytes Relative: 31.8 % (ref 12.0–46.0)
Lymphs Abs: 2.3 10*3/uL (ref 0.7–4.0)
MCHC: 34.2 g/dL (ref 30.0–36.0)
MCV: 87.4 fl (ref 78.0–100.0)
Monocytes Absolute: 0.4 10*3/uL (ref 0.1–1.0)
Monocytes Relative: 5.3 % (ref 3.0–12.0)
Neutro Abs: 4.4 10*3/uL (ref 1.4–7.7)
Neutrophils Relative %: 61.8 % (ref 43.0–77.0)
Platelets: 250 10*3/uL (ref 150.0–400.0)
RBC: 4.7 Mil/uL (ref 3.87–5.11)
RDW: 12.7 % (ref 11.5–15.5)
WBC: 7.2 10*3/uL (ref 4.0–10.5)

## 2020-11-15 LAB — LIPID PANEL
Cholesterol: 141 mg/dL (ref 0–200)
HDL: 45.3 mg/dL (ref >39.00)
LDL Cholesterol: 76 mg/dL (ref 0–99)
NonHDL: 95.48
Total CHOL/HDL Ratio: 3
Triglycerides: 95 mg/dL (ref 0.0–149.0)
VLDL: 19 mg/dL (ref 0.0–40.0)

## 2020-11-15 LAB — TSH: TSH: 1.21 u[IU]/mL (ref 0.35–4.50)

## 2020-11-15 LAB — COMPREHENSIVE METABOLIC PANEL
ALT: 10 U/L (ref 0–35)
AST: 14 U/L (ref 0–37)
Albumin: 4.9 g/dL (ref 3.5–5.2)
Alkaline Phosphatase: 69 U/L (ref 39–117)
BUN: 12 mg/dL (ref 6–23)
CO2: 27 mEq/L (ref 19–32)
Calcium: 9.6 mg/dL (ref 8.4–10.5)
Chloride: 102 mEq/L (ref 96–112)
Creatinine, Ser: 0.74 mg/dL (ref 0.40–1.20)
GFR: 114.5 mL/min (ref >60.00)
Glucose, Bld: 85 mg/dL (ref 70–99)
Potassium: 4.1 mEq/L (ref 3.5–5.1)
Sodium: 136 mEq/L (ref 135–145)
Total Bilirubin: 0.8 mg/dL (ref 0.2–1.2)
Total Protein: 7.7 g/dL (ref 6.0–8.3)

## 2020-11-20 NOTE — Progress Notes (Signed)
Cardiology Office Note Date:  11/22/2020  Patient ID:  Karla Hughes, DOB 1996/03/26, MRN [id number] PCP:  Dale [place], MD  Cardiologist: Dr. Cassie Freer Electrophysiologist: Dr. Elberta Fortis    Chief Complaint:  over due visit  History of Present Illness: Karla Hughes is a 24 y.o. female with history of palpitations.  She initially was referred to Dr. Elberta Fortis in 2018 with palpitations, hot flashes, nausea, headaches.  Episode occurred 2-3 times a day lasting 5 to 10 minutes with and without exertion.  Her apple watch revealed heart rates in the 170s with light exercise in the low 100s at rest.  She typically plays sports without issue.  24-hour monitor showed sinus rhythm with a mean heart rate of 101 with episodes of sinus tachycardia at 133 bpm.  Echo 04/17/2017 showed normal LV systolic function with an EF of 50% and mild tricuspid regurgitation Started on diltiazem for inappropriate ST.  She last saw him Jan 2021, at that time doing better though still having some palpitations and her diltiazem increased to 240mg  daily with plans for 72mo follow up.  TODAY She is doing very well.  No palpitations with the increased dose. She is studying so not exercising as much as she used to but is and remains very active.  No CP or exertional intolerances. No dizzy spells, near syncope or syncope.  She denies pregnancy  Past Medical History:  Diagnosis Date  . Allergy   . Headache     Past Surgical History:  Procedure Laterality Date  . MYRINGOTOMY WITH TUBE PLACEMENT     x 2    Current Outpatient Medications  Medication Sig Dispense Refill  . diltiazem (CARDIZEM CD) 240 MG 24 hr capsule Take 1 capsule (240 mg total) by mouth daily. 90 capsule 2  . norethindrone-ethinyl estradiol (LOESTRIN FE 1/20) 1-20 MG-MCG tablet Take 1 tablet by mouth daily. 28 tablet 11   No current facility-administered medications for this visit.    Allergies:   Patient has no known allergies.    Social History:  The patient  reports that she has never smoked. She has never used smokeless tobacco. She reports that she does not drink alcohol and does not use drugs.   Family History:  The patient's family history includes Breast cancer in an other family member; Heart disease in an other family member; Multiple sclerosis in her maternal uncle, mother, and another family member.  ROS:  Please see the history of present illness.    All other systems are reviewed and otherwise negative.   PHYSICAL EXAM:  VS:  BP 116/68   Pulse 78   Ht 5\' 4"  (1.626 m)   Wt 133 lb (60.3 kg)   SpO2 96%   BMI 22.83 kg/m  BMI: Body mass index is 22.83 kg/m. Well nourished, well developed, in no acute distress HEENT: normocephalic, atraumatic Neck: no JVD, carotid bruits or masses Cardiac:  RRR; no significant murmurs, no rubs, or gallops Lungs:  CTA b/l, no wheezing, rhonchi or rales Abd: soft, nontender MS: no deformity or atrophy Ext: no edema Skin: warm and dry, no rash Neuro:  No gross deficits appreciated Psych: euthymic mood, full affect   EKG:  Done today and reviewed by myself shows  SR, sinus arrhythmia 78bpm   TTE 04/18/17  NORMAL LEFT VENTRICULAR SYSTOLIC FUNCTION NORMAL RIGHT VENTRICULAR SYSTOLIC FUNCTION MILD VALVULAR REGURGITATION (See above) NO VALVULAR STENOSIS EF 50%   Recent Labs: 11/15/2020: ALT 10; BUN 12; Creatinine, Ser 0.74; Hemoglobin 14.0;  Platelets 250.0; Potassium 4.1; Sodium 136; TSH 1.21  11/15/2020: Cholesterol 141; HDL 45.30; LDL Cholesterol 76; Total CHOL/HDL Ratio 3; Triglycerides 95.0; VLDL 19.0   Estimated Creatinine Clearance: 95.3 mL/min (by C-G formula based on SCr of 0.74 mg/dL).   Wt Readings from Last 3 Encounters:  11/22/20 133 lb (60.3 kg)  11/08/20 133 lb (60.3 kg)  12/17/19 136 lb 6.4 oz (61.9 kg)     Other studies reviewed: Additional studies/records reviewed today include: summarized above  ASSESSMENT AND PLAN:  1.  Palpitations 2. Inappropriate ST     No ongoing palpitations   I have counseled her on her diltiazem and pregnancy She reports not currently pregnant and advised should she become pregnant to stop it and notify us.  Disposition: F/u with [place] in a year, sooner if needed  Current medicines are reviewed at length with the patient today.  The patient did not have any concerns regarding medicines.  Venetia Night, PA-C 11/22/2020 11:41 AM     [facility] [place] [address] [phone] (office)  [phone] (fax)

## 2020-11-22 ENCOUNTER — Encounter: Payer: Self-pay | Admitting: Physician Assistant

## 2020-11-22 ENCOUNTER — Ambulatory Visit: Payer: 59 | Admitting: Physician Assistant

## 2020-11-22 ENCOUNTER — Other Ambulatory Visit: Payer: Self-pay

## 2020-11-22 VITALS — BP 116/68 | HR 78 | Ht 64.0 in | Wt 133.0 lb

## 2020-11-22 DIAGNOSIS — R002 Palpitations: Secondary | ICD-10-CM | POA: Diagnosis not present

## 2020-11-22 DIAGNOSIS — R000 Tachycardia, unspecified: Secondary | ICD-10-CM

## 2020-11-22 MED ORDER — DILTIAZEM HCL ER COATED BEADS 240 MG PO CP24: 240.0000 mg | ORAL_CAPSULE | Freq: Every day | ORAL | 2 refills | Status: DC

## 2020-11-22 NOTE — Patient Instructions (Addendum)
Medication Instructions:   Your physician recommends that you continue on your current medications as directed. Please refer to the Current Medication list given to you today.  *If you need a refill on your cardiac medications before your next appointment, please call your pharmacy*   Lab Work: NONE ORDERED  TODAY   If you have labs (blood work) drawn today and your tests are completely normal, you will receive your results only by: Marland Kitchen MyChart Message (if you have MyChart) OR . A paper copy in the mail If you have any lab test that is abnormal or we need to change your treatment, we will call you to review the results.   Testing/Procedures: NONE ORDERED  TODAY     Follow-Up: At [facility], you and your health needs are our priority.  As part of our continuing mission to provide you with exceptional heart care, we have created designated Provider Care Teams.  These Care Teams include your primary Cardiologist (physician) and Advanced Practice Providers (APPs -  Physician Assistants and Nurse Practitioners) who all work together to provide you with the care you need, when you need it.  We recommend signing up for the patient portal called "MyChart".  Sign up information is provided on this After Visit Summary.  MyChart is used to connect with patients for Virtual Visits (Telemedicine).  Patients are able to view lab/test results, encounter notes, upcoming appointments, etc.  Non-urgent messages can be sent to your provider as well.   To learn more about what you can do with MyChart, go to [email or web address].    Your next appointment:   1 year(s)  The format for your next appointment:   In Person  Provider:  Dr. Elberta Fortis    Other Instructions

## 2020-12-01 ENCOUNTER — Other Ambulatory Visit: Payer: Self-pay | Admitting: Cardiology

## 2020-12-01 ENCOUNTER — Other Ambulatory Visit: Payer: Self-pay

## 2020-12-01 MED ORDER — DILTIAZEM HCL ER COATED BEADS 240 MG PO CP24: 240.0000 mg | ORAL_CAPSULE | Freq: Every day | ORAL | 3 refills | Status: DC

## 2020-12-01 MED FILL — DILTIAZEM 24HR ER 240 MG CA: 240 | 90 days supply | Qty: 90 | Fill #0

## 2020-12-01 NOTE — Telephone Encounter (Signed)
Pt's medication was sent to pt's pharmacy as requested. Confirmation received.  °

## 2020-12-25 ENCOUNTER — Ambulatory Visit
Admission: RE | Admit: 2020-12-25 | Discharge: 2020-12-25 | Disposition: A | Discharge status: Home / Self Care | Payer: 59 | Source: Ambulatory Visit | Attending: Emergency Medicine | Admitting: Emergency Medicine

## 2020-12-25 ENCOUNTER — Other Ambulatory Visit: Payer: Self-pay

## 2020-12-25 VITALS — BP 110/79 | HR 76 | Temp 98.6°F | Resp 18 | Ht 64.0 in | Wt 131.0 lb

## 2020-12-25 DIAGNOSIS — L03032 Cellulitis of left toe: Secondary | ICD-10-CM | POA: Diagnosis not present

## 2020-12-25 MED ORDER — SULFAMETHOXAZOLE-TRIMETHOPRIM 800-160 MG PO TABS: 1.0000 | ORAL_TABLET | Freq: Two times a day (BID) | ORAL | 0 refills | Status: AC

## 2020-12-25 NOTE — ED Provider Notes (Signed)
[facility]    CSN: [id number] Arrival date & time: 12/25/20  1341      History   Chief Complaint Chief Complaint  Patient presents with  . Toe Pain    HPI Karla Hughes is a 25 y.o. female.   HPI   66-year-old female here for evaluation of pain, redness, and drainage from her left great toe.  Patient reports that November 11, 2020 she had an ingrown toe nail removed by Dr. Posey Pronto.  She states that she had complete resolution after her surgery.  Her current symptoms began 6 days ago.  She reports that she has had some discharge that is bloody and green from her cuticle.  She has tenderness on the side of her big toe but not around the cuticle.  There is some mild redness and minimal swelling.  Past Medical History:  Diagnosis Date  . Allergy   . Headache     Patient Active Problem List   Diagnosis Date Noted  . Encounter for completion of form with patient 05/23/2019  . Tachycardia 06/19/2017  . Healthcare maintenance 06/19/2017  . Exertional dyspnea 03/22/2017  . 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 Other vascular headache 06/22/2016  . Family history of multiple sclerosis 06/22/2016  . Insomnia 06/22/2016  . Viral syndrome 03/19/2016  . Fatigue 03/06/2016  . Lymphadenopathy 03/06/2016  . Environmental allergies 03/06/2016  . Headache 03/06/2016  . Birth control counseling 03/06/2016    Past Surgical History:  Procedure Laterality Date  . MYRINGOTOMY WITH TUBE PLACEMENT     x 2    OB History   No obstetric history on file.      Home Medications    Prior to Admission medications   Medication Sig Start Date End Date Taking? Authorizing Provider  diltiazem (CARDIZEM CD) 240 MG 24 hr capsule Take 1 capsule (240 mg total) by mouth daily. 12/01/20  Yes Camnitz, Ocie Doyne, MD  norethindrone-ethinyl estradiol (LOESTRIN FE 1/20) 1-20 MG-MCG tablet Take 1 tablet by mouth daily. 11/08/20  Yes Einar Pheasant, MD   sulfamethoxazole-trimethoprim (BACTRIM DS) 800-160 MG tablet Take 1 tablet by mouth 2 (two) times daily for 10 days. 12/25/20 01/04/21 Yes Margarette Canada, NP    Family History Family History  Problem Relation Age of Onset  . Multiple sclerosis Mother   . Breast cancer Other        grandmother  . Multiple sclerosis Other        mother  . Heart disease Other        grandparent  . Multiple sclerosis Maternal Uncle     Social History Social History   Tobacco Use  . Smoking status: Never Smoker  . Smokeless tobacco: Never Used  Vaping Use  . Vaping Use: Never used  Substance Use Topics  . Alcohol use: No    Alcohol/week: 0.0 standard drinks  . Drug use: No     Allergies   Patient has no known allergies.   Review of Systems Review of Systems  Constitutional: Negative for activity change, appetite change and fever.  Musculoskeletal: Positive for myalgias. Negative for arthralgias.  Skin: Positive for color change. Negative for rash.  Hematological: Negative.   Psychiatric/Behavioral: Negative.      Physical Exam Triage Vital Signs ED Triage Vitals  Enc Vitals Group     BP 12/25/20 1355 110/79     Pulse Rate 12/25/20 1355 76     Resp 12/25/20 1355 18     Temp 12/25/20 1355  98.6 F (37 C)     Temp Source 12/25/20 1355 Oral     SpO2 12/25/20 1355 100 %     Weight 12/25/20 1353 131 lb (59.4 kg)     Height 12/25/20 1353 5\' 4"  (1.626 m)     Head Circumference --      Peak Flow --      Pain Score 12/25/20 1353 1     Pain Loc --      Pain Edu? --      Excl. in [place]? --    No data found.  Updated Vital Signs BP 110/79 (BP Location: Left Arm)   Pulse 76   Temp 98.6 F (37 C) (Oral)   Resp 18   Ht 5\' 4"  (1.626 m)   Wt 131 lb (59.4 kg)   LMP 12/10/2020   SpO2 100%   BMI 22.49 kg/m   Visual Acuity Right Eye Distance:   Left Eye Distance:   Bilateral Distance:    Right Eye Near:   Left Eye Near:    Bilateral Near:     Physical Exam Vitals and nursing  note reviewed.  Constitutional:      General: She is not in acute distress.    Appearance: Normal appearance. She is normal weight. She is not ill-appearing.  Musculoskeletal:        General: Tenderness present. No swelling.  Skin:    General: Skin is warm and dry.     Capillary Refill: Capillary refill takes less than 2 seconds.     Findings: Erythema present.  Neurological:     General: No focal deficit present.     Mental Status: She is alert and oriented to person, place, and time.  Psychiatric:        Mood and Affect: Mood normal.        Behavior: Behavior normal.        Thought Content: Thought content normal.        Judgment: Judgment normal.      UC Treatments / Results  Labs (all labs ordered are listed, but only abnormal results are displayed) Labs Reviewed - No data to display  EKG   Radiology No results found.  Procedures Procedures (including critical care time)  Medications Ordered in UC Medications - No data to display  Initial Impression / Assessment and Plan / UC Course  I have reviewed the triage vital signs and the nursing notes.  Pertinent labs & imaging results that were available during my care of the patient were reviewed by me and considered in my medical decision making (see chart for details).   Patient has swelling to the lateral aspect of her left great toe as well as redness to the proximal cuticle and the lateral edge of the great toe along the toenail.  Patient does have some serosanguineous drainage from the lateral aspect of the cuticle.  DP and PT pulses are 2+, patient has full sensation and range of motion, there are no red streaks ascending the foot, and patient has full sensation and range of motion of her toes.  Patient's exam is consistent with a paronychia.  Will treat with Bactrim twice daily for 10 days and have her follow-up with Dr. Posey Pronto next week.   Final Clinical Impressions(s) / UC Diagnoses   Final diagnoses:   Paronychia of fifth toe, left     Discharge Instructions     Continue to soak your foot in warm water Epson salts twice daily.  Cover your toe with a Band-Aid but stop applying Neosporin.  Take the Bactrim twice daily for 10 days for treatment of the paronychia.  Follow-up next week with Dr. Posey Pronto to ensure that you are having resolution of the infection.    ED Prescriptions    Medication Sig Dispense Auth. Provider   sulfamethoxazole-trimethoprim (BACTRIM DS) 800-160 MG tablet Take 1 tablet by mouth 2 (two) times daily for 10 days. 20 tablet Margarette Canada, NP     PDMP not reviewed this encounter.   Margarette Canada, NP 12/25/20 1418

## 2020-12-25 NOTE — Discharge Instructions (Addendum)
Continue to soak your foot in warm water Epson salts twice daily.  Cover your toe with a Band-Aid but stop applying Neosporin.  Take the Bactrim twice daily for 10 days for treatment of the paronychia.  Follow-up next week with Dr. Posey Pronto to ensure that you are having resolution of the infection.

## 2020-12-25 NOTE — ED Triage Notes (Signed)
Pt has c/o pain to left great toe. Pt states she had an ingrown nail in December and did have it removed by a podiatrist. Pt states the nail area is oozing and very painful. Pt is concerned about infection.

## 2020-12-27 ENCOUNTER — Ambulatory Visit: Payer: 59 | Admitting: Podiatry

## 2020-12-29 ENCOUNTER — Ambulatory Visit: Payer: 59 | Admitting: Podiatry

## 2020-12-29 ENCOUNTER — Encounter: Payer: Self-pay | Admitting: Podiatry

## 2020-12-29 ENCOUNTER — Other Ambulatory Visit: Payer: Self-pay

## 2020-12-29 DIAGNOSIS — L600 Ingrowing nail: Secondary | ICD-10-CM

## 2020-12-29 DIAGNOSIS — L929 Granulomatous disorder of the skin and subcutaneous tissue, unspecified: Secondary | ICD-10-CM | POA: Diagnosis not present

## 2021-01-03 ENCOUNTER — Encounter: Payer: Self-pay | Admitting: Podiatry

## 2021-01-03 NOTE — Progress Notes (Signed)
Subjective:  Patient ID: Karla Hughes, Karla Hughes    DOB: [birth year],  MRN: [id number]  Chief Complaint  Patient presents with  . Nail Problem    Patient presents today for infected left hallux after nail avulsion.  She says she was seen at urgent care last Sunday and was given Bactrim.  She says "it doesn't hurt, but looks terrible"    25 y.o. Karla Hughes presents with the above complaint.  Patient presents with complaint of possible infection to the left hallux after undergoing nail avulsion.  Patient was seen last Sunday at urgent care and was given Bactrim.  She states it does not hurt but looks terrible.  She states that there is still some redness and swelling associated with it.  She denies any other acute complaints.  She had done her soaks as instructed.  She is here with her mother today.   Review of Systems: Negative except as noted in the HPI. Denies N/V/F/Ch.  Past Medical History:  Diagnosis Date  . Allergy   . Headache     Current Outpatient Medications:  .  diltiazem (CARDIZEM CD) 240 MG 24 hr capsule, Take 1 capsule (240 mg total) by mouth daily., Disp: 90 capsule, Rfl: 3 .  norethindrone-ethinyl estradiol (LOESTRIN FE 1/20) 1-20 MG-MCG tablet, Take 1 tablet by mouth daily., Disp: 28 tablet, Rfl: 11 .  sulfamethoxazole-trimethoprim (BACTRIM DS) 800-160 MG tablet, Take 1 tablet by mouth 2 (two) times daily for 10 days., Disp: 20 tablet, Rfl: 0  Social History   Tobacco Use  Smoking Status Never Smoker  Smokeless Tobacco Never Used    No Known Allergies Objective:  There were no vitals filed for this visit. There is no height or weight on file to calculate BMI. Constitutional Well developed. Well nourished.  Vascular Dorsalis pedis pulses palpable bilaterally. Posterior tibial pulses palpable bilaterally. Capillary refill normal to all digits.  No cyanosis or clubbing noted. Pedal hair growth normal.  Neurologic Normal speech. Oriented to person, place, and  time. Epicritic sensation to light touch grossly present bilaterally.  Dermatologic  granuloma of the left hallux medial fold noted.  Mild erythema noted.  No pain on palpation to the granuloma.  No purulent drainage expressed no wound noted.  Orthopedic: Normal joint ROM without pain or crepitus bilaterally. No visible deformities. No bony tenderness.   Radiographs: None Assessment:   1. Ingrown left big toenail   2. Granuloma of skin    Plan:  Patient was evaluated and treated and all questions answered.  Left hallux granuloma secondary to ingrown nail removal -I explained to the patient the etiology of granuloma and various treatment options were discussed.  I discussed with the patient and her mother that sometimes this can happen as the skin underneath can go over the nail as opposed to stopping can lead to over formation of the skin.  I believe she will benefit from removal of the granuloma.  Patient agrees with the plan would like to remove the granuloma.  At this time I do not see any concern for regrowing or recurrence of the ingrown nail. -Left hallux was prepped with Betadine followed by injection of one-to-one mixture of 1% lidocaine plain half percent Marcaine plain to achieve anesthesia.  Once anesthesia was achieved using a tissue nipper the granuloma was resected.  No further ingrowing noted.  No bleeding noted.  Triple antibiotic dressing was applied to the toe.  No complication noted. -I have asked her to keep it covered with a  Band-Aid.  No follow-ups on file.

## 2021-01-04 ENCOUNTER — Encounter: Payer: Self-pay | Admitting: Podiatry

## 2021-02-07 ENCOUNTER — Ambulatory Visit: Payer: 59 | Admitting: Podiatry

## 2021-02-07 ENCOUNTER — Other Ambulatory Visit: Payer: Self-pay

## 2021-02-07 ENCOUNTER — Encounter: Payer: Self-pay | Admitting: Podiatry

## 2021-02-07 DIAGNOSIS — L929 Granulomatous disorder of the skin and subcutaneous tissue, unspecified: Secondary | ICD-10-CM | POA: Diagnosis not present

## 2021-02-07 DIAGNOSIS — L600 Ingrowing nail: Secondary | ICD-10-CM

## 2021-02-07 NOTE — Progress Notes (Signed)
  Subjective:  Patient ID: Karla Hughes, female    DOB: [birth year],  MRN: [id number]  Chief Complaint  Patient presents with  . Nail Problem    "I don't think my toe is healing up where he took the ingrown out"      25 y.o. female presents with the above complaint.  Patient presents with a follow-up of infection to the left hallux/granuloma.  Patient states that she is doing well overall.  She does not have pain.  She is worried that it might not be healing appropriately.  She denies any other acute complaints.   Review of Systems: Negative except as noted in the HPI. Denies N/V/F/Ch.  Past Medical History:  Diagnosis Date  . Allergy   . Headache     Current Outpatient Medications:  .  diltiazem (CARDIZEM CD) 240 MG 24 hr capsule, Take 1 capsule (240 mg total) by mouth daily., Disp: 90 capsule, Rfl: 3 .  norethindrone-ethinyl estradiol (LOESTRIN FE 1/20) 1-20 MG-MCG tablet, Take 1 tablet by mouth daily., Disp: 28 tablet, Rfl: 11  Social History   Tobacco Use  Smoking Status Never Smoker  Smokeless Tobacco Never Used    No Known Allergies Objective:  There were no vitals filed for this visit. There is no height or weight on file to calculate BMI. Constitutional Well developed. Well nourished.  Vascular Dorsalis pedis pulses palpable bilaterally. Posterior tibial pulses palpable bilaterally. Capillary refill normal to all digits.  No cyanosis or clubbing noted. Pedal hair growth normal.  Neurologic Normal speech. Oriented to person, place, and time. Epicritic sensation to light touch grossly present bilaterally.  Dermatologic  resolving granuloma of the left hallux medial fold noted. No erythema noted.  No pain on palpation to the granuloma.  No purulent drainage expressed no wound noted.  Orthopedic: Normal joint ROM without pain or crepitus bilaterally. No visible deformities. No bony tenderness.   Radiographs: None Assessment:   1. Ingrown left big toenail    2. Granuloma of skin    Plan:  Patient was evaluated and treated and all questions answered.  Left hallux granuloma secondary to ingrown nail removal -Clinically healing with scab formation.  I removed the scab.  Patient may be having some a regrowing of the ingrown nail.  However it is not painful at this time.  I discussed with her that if it does start becoming painful we can redo it.  She states understanding.  No follow-ups on file.

## 2021-03-17 ENCOUNTER — Other Ambulatory Visit (HOSPITAL_COMMUNITY): Payer: Self-pay

## 2021-03-17 MED FILL — Diltiazem HCl Coated Beads Cap ER 24HR 240 MG: ORAL | 90 days supply | Qty: 90 | Fill #0 | Status: AC

## 2021-04-03 ENCOUNTER — Telehealth: Payer: Self-pay | Admitting: Internal Medicine

## 2021-04-03 NOTE — Telephone Encounter (Signed)
PT would like to get a printout of their immunizations records.

## 2021-04-03 NOTE — Telephone Encounter (Signed)
Printed and placed upfront. Patient informed and verbalized understanding, will come to pick this up today.

## 2021-07-05 ENCOUNTER — Other Ambulatory Visit (HOSPITAL_COMMUNITY): Payer: Self-pay

## 2021-07-05 MED FILL — Diltiazem HCl Coated Beads Cap ER 24HR 240 MG: ORAL | 90 days supply | Qty: 90 | Fill #1 | Status: AC

## 2021-08-03 ENCOUNTER — Telehealth: Payer: Self-pay | Admitting: Cardiology

## 2021-08-03 NOTE — Telephone Encounter (Signed)
Pt c/o of Chest Pain: STAT if CP now or developed within 24 hours  1. Are you having CP right now? Not at this time  2. Are you experiencing any other symptoms (ex. SOB, nausea, vomiting, sweating)?  Very fatigued  3. How long have you been experiencing CP? yesterday  4. Is your CP continuous or coming and going? Coming and going  5. Have you taken Nitroglycerin? No- pt wanted an appointment- I made her appointment with Jonni Sanger for Tuesday(08-08-21) ?

## 2021-08-03 NOTE — Telephone Encounter (Signed)
Followed up with pt who reports chest discomfort/pain that started yesterday around 4pm when she was driving home. She describes a sharp pain in her upper left chest area, "right underneath my collar bone, in that area". She did have some on and off pain but it was not consistent in nature, lasted 2/3 sec at a time, 3/4 pain level out of 10, non radiating.  Occurred randomly.   Pt advised that if pain worsens/changes in nature and/or radiating pain occurs along side of the CP then she should report to the ED immediately. Patient verbalized understanding and agreeable to plan.  She will keep appt next week to further discuss concerns.

## 2021-08-07 NOTE — Progress Notes (Signed)
PCP:  Einar Pheasant, MD Primary Cardiologist: None Electrophysiologist: Will Meredith Leeds, MD   Karla Hughes is a 25 y.o. female seen today for Will Meredith Leeds, MD for acute visit due to atypical chest pain .  Since last being seen in our clinic the patient reports doing OK. She started going to they gym 3-4 times a week approx 2 months ago.  She does mild cardio on the treadmill to warm up, and then does body weight exercise and light weight curls, presses, and tricep extensions/etc. Last week, she was driving when she had a sudden sharp pain in her left upper chest. This has continued intermittently through the weekend. No clear aggravating or relieving factors. She has continued to go to the gym without symptoms. It primarily occurs when she is seated at rest. It lasts for a few seconds at most, and can occur hours apart, or 1-2 times then not for the rest of the day. She denies exertional chest pain. Her chronic palpitations are relatively well controlled.   Past Medical History:  Diagnosis Date   Allergy    Headache    Past Surgical History:  Procedure Laterality Date   MYRINGOTOMY WITH TUBE PLACEMENT     x 2    Current Outpatient Medications  Medication Sig Dispense Refill   diltiazem (CARDIZEM CD) 240 MG 24 hr capsule TAKE 1 CAPSULE BY MOUTH ONCE A DAY 90 capsule 3   norethindrone-ethinyl estradiol (LOESTRIN FE 1/20) 1-20 MG-MCG tablet Take 1 tablet by mouth daily. 28 tablet 11   No current facility-administered medications for this visit.    No Known Allergies  Social History   Socioeconomic History   Marital status: Single    Spouse name: Not on file   Number of children: Not on file   Years of education: Not on file   Highest education level: Not on file  Occupational History   Not on file  Tobacco Use   Smoking status: Never   Smokeless tobacco: Never  Vaping Use   Vaping Use: Never used  Substance and Sexual Activity   Alcohol use: No     Alcohol/week: 0.0 standard drinks   Drug use: No   Sexual activity: Never    Birth control/protection: Abstinence  Other Topics Concern   Not on file  Social History Narrative   Not on file   Social Determinants of Health   Financial Resource Strain: Not on file  Food Insecurity: Not on file  Transportation Needs: Not on file  Physical Activity: Not on file  Stress: Not on file  Social Connections: Not on file  Intimate Partner Violence: Not on file     Review of Systems: All other systems reviewed and are otherwise negative except as noted above.  Physical Exam: Vitals:   08/08/21 0810  BP: 110/68  Pulse: 73  Weight: 132 lb 3.2 oz (60 kg)  Height: '5\' 4"'$  (1.626 m)    GEN- The patient is well appearing, alert and oriented x 3 today.   HEENT: normocephalic, atraumatic; sclera clear, conjunctiva pink; hearing intact; oropharynx clear; neck supple, no JVP Lymph- no cervical lymphadenopathy Lungs- Clear to ausculation bilaterally, normal work of breathing.  No wheezes, rales, rhonchi Heart- Regular rate and rhythm, no murmurs, rubs or gallops, PMI not laterally displaced GI- soft, non-tender, non-distended, bowel sounds present, no hepatosplenomegaly Extremities- no clubbing, cyanosis, or edema; DP/PT/radial pulses 2+ bilaterally MS- no significant deformity or atrophy Skin- warm and dry, no rash or lesion  Psych- euthymic mood, full affect Neuro- strength and sensation are intact  EKG is ordered. Personal review of EKG from today shows NSR at 79 bpm with a brief run of an "atrial tach" right around 100 bpm  Additional studies reviewed include: Previous EP office notes.   Assessment and Plan:  1. Palpitations 2. Inappropriate ST Continue diltiazem 240 mg daily Overall symptoms controlled Labs today  3. Atypical chest pain Highly suspicious of MSK involvement most likely We will do an exercise stress for completeness Reviewed alarm symptoms Recommended trigger  avoidance if any could be found. She exercises her arms/shoulders twice a week so does not appear to be "over-doing it". No specific trauma to the area.   RTC 6 months, sooner with further issues or pending results of stress test.  Shirley Friar, PA-C  08/08/21 8:22 AM

## 2021-08-08 ENCOUNTER — Encounter: Payer: Self-pay | Admitting: Student

## 2021-08-08 ENCOUNTER — Other Ambulatory Visit: Payer: Self-pay

## 2021-08-08 ENCOUNTER — Ambulatory Visit: Payer: 59 | Admitting: Student

## 2021-08-08 VITALS — BP 110/68 | HR 73 | Ht 64.0 in | Wt 132.2 lb

## 2021-08-08 DIAGNOSIS — R0789 Other chest pain: Secondary | ICD-10-CM

## 2021-08-08 DIAGNOSIS — R000 Tachycardia, unspecified: Secondary | ICD-10-CM | POA: Diagnosis not present

## 2021-08-08 DIAGNOSIS — R002 Palpitations: Secondary | ICD-10-CM

## 2021-08-08 LAB — CBC
Hematocrit: 44.9 % (ref 34.0–46.6)
Hemoglobin: 15.2 g/dL (ref 11.1–15.9)
MCH: 30 pg (ref 26.6–33.0)
MCHC: 33.9 g/dL (ref 31.5–35.7)
MCV: 89 fL (ref 79–97)
Platelets: 284 10*3/uL (ref 150–450)
RBC: 5.07 x10E6/uL (ref 3.77–5.28)
RDW: 11.9 % (ref 11.7–15.4)
WBC: 6 10*3/uL (ref 3.4–10.8)

## 2021-08-08 LAB — BASIC METABOLIC PANEL
BUN/Creatinine Ratio: 14 (ref 9–23)
BUN: 11 mg/dL (ref 6–20)
CO2: 22 mmol/L (ref 20–29)
Calcium: 10 mg/dL (ref 8.7–10.2)
Chloride: 105 mmol/L (ref 96–106)
Creatinine, Ser: 0.8 mg/dL (ref 0.57–1.00)
Glucose: 86 mg/dL (ref 65–99)
Potassium: 4.5 mmol/L (ref 3.5–5.2)
Sodium: 142 mmol/L (ref 134–144)
eGFR: 106 mL/min/{1.73_m2} (ref >59)

## 2021-08-08 LAB — TSH: TSH: 1.15 u[IU]/mL (ref 0.450–4.500)

## 2021-08-08 NOTE — Patient Instructions (Signed)
Medication Instructions:  Your physician recommends that you continue on your current medications as directed. Please refer to the Current Medication list given to you today.  *If you need a refill on your cardiac medications before your next appointment, please call your pharmacy*   Lab Work: TODAY: BMET, CBC, TSH  If you have labs (blood work) drawn today and your tests are completely normal, you will receive your results only by: [place] (if you have MyChart) OR A paper copy in the mail If you have any lab test that is abnormal or we need to change your treatment, we will call you to review the results.   Testing/Procedures: Your physician has requested that you have an exercise tolerance test. For further information please visit [email or web address]. Please also follow instruction sheet, as given.   Follow-Up: At [facility], you and your health needs are our priority.  As part of our continuing mission to provide you with exceptional heart care, we have created designated Provider Care Teams.  These Care Teams include your primary Cardiologist (physician) and Advanced Practice Providers (APPs -  Physician Assistants and Nurse Practitioners) who all work together to provide you with the care you need, when you need it.   Your next appointment:   6 month(s)  The format for your next appointment:   In Person  Provider:   You may see Will Meredith Leeds, MD or one of the following Advanced Practice Providers on your designated Care Team:   Tommye Standard, [place] Legrand Como "[facility]" [place], [place]

## 2021-09-21 ENCOUNTER — Other Ambulatory Visit: Payer: Self-pay | Admitting: Internal Medicine

## 2021-11-07 ENCOUNTER — Other Ambulatory Visit: Payer: Self-pay

## 2021-11-07 ENCOUNTER — Encounter: Payer: Self-pay | Admitting: Emergency Medicine

## 2021-11-07 ENCOUNTER — Other Ambulatory Visit (HOSPITAL_COMMUNITY): Payer: Self-pay

## 2021-11-07 ENCOUNTER — Ambulatory Visit
Admission: EM | Admit: 2021-11-07 | Discharge: 2021-11-07 | Disposition: A | Discharge status: Home / Self Care | Payer: 59 | Attending: Emergency Medicine | Admitting: Emergency Medicine

## 2021-11-07 DIAGNOSIS — B349 Viral infection, unspecified: Secondary | ICD-10-CM | POA: Diagnosis not present

## 2021-11-07 LAB — POCT INFLUENZA A/B
Influenza A, POC: NEGATIVE
Influenza B, POC: NEGATIVE

## 2021-11-07 MED ORDER — PAXLOVID (300/100) 20 X 150 MG & 10 X 100MG PO TBPK
ORAL_TABLET | ORAL | 0 refills | Status: DC
  Filled 2021-11-07: qty 30, 5d supply, fill #0

## 2021-11-07 NOTE — ED Triage Notes (Signed)
Pt here with flu-like sx since yesterday.

## 2021-11-07 NOTE — Discharge Instructions (Addendum)
Your flu test is negative.    Take Tylenol or ibuprofen as needed for fever or discomfort.  Rest and keep yourself hydrated.    Follow-up with your primary care provider if your symptoms are not improving.

## 2021-11-07 NOTE — ED Provider Notes (Signed)
Roderic Palau    CSN: [id number] Arrival date & time: 11/07/21  6283      History   Chief Complaint Chief Complaint  Patient presents with   Generalized Body Aches   Fever   Nasal Congestion   Sore Throat    HPI Shauntea Lok Revell is a 25 y.o. female.  Patient presents with fever, headache, body aches, runny nose, sore throat since yesterday.  T-max 101.6 this morning.  Treatment at home with ibuprofen.  No cough, shortness of breath, vomiting, diarrhea, rash, or other symptoms.  Her medical history includes seasonal allergies and sinus tachycardia.  The history is provided by the patient and medical records.   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      Home Medications    Prior to Admission medications   Medication Sig Start Date End Date Taking? Authorizing Provider  BLISOVI FE 1/20 1-20 MG-MCG tablet TAKE 1 TABLET BY MOUTH DAILY 09/21/21   Einar Pheasant, MD  diltiazem (CARDIZEM CD) 240 MG 24 hr capsule TAKE 1 CAPSULE BY MOUTH ONCE A DAY 12/01/20 12/01/21  Camnitz, Ocie Doyne, MD    Family History Family History  Problem Relation Age of Onset   Multiple sclerosis Mother    Breast cancer Other        grandmother   Multiple sclerosis Other         mother   Heart disease Other        grandparent   Multiple sclerosis Maternal Uncle     Social History Social History   Tobacco Use   Smoking status: Never   Smokeless tobacco: Never  Vaping Use   Vaping Use: Never used  Substance Use Topics   Alcohol use: No    Alcohol/week: 0.0 standard drinks   Drug use: No     Allergies   Patient has no known allergies.   Review of Systems Review of Systems  Constitutional:  Positive for fever. Negative for chills.  HENT:  Positive for rhinorrhea and sore throat. Negative for ear pain.   Respiratory:  Negative for cough and shortness of breath.   Cardiovascular:  Negative for chest pain and palpitations.  Gastrointestinal:  Negative for diarrhea and vomiting.  Skin:  Negative for color change and rash.  Neurological:  Positive for headaches.  All other systems reviewed and are negative.   Physical Exam Triage Vital Signs ED Triage Vitals  Enc Vitals Group     BP 11/07/21 0923 120/74     Pulse Rate 11/07/21 0923 (!) 126     Resp 11/07/21 0923 18     Temp 11/07/21 0923 99.7 F (37.6 C)     Temp Source 11/07/21 0923 Oral     SpO2 11/07/21  0923 96 %     Weight --      Height --      Head Circumference --      Peak Flow --      Pain Score 11/07/21 0916 5     Pain Loc --      Pain Edu? --      Excl. in [place]? --    No data found.  Updated Vital Signs BP 120/74 (BP Location: Left Arm)    Pulse (!) 126    Temp 99.7 F (37.6 C) (Oral)    Resp 18    SpO2 96%   Visual Acuity Right Eye Distance:   Left Eye Distance:   Bilateral Distance:    Right Eye Near:   Left Eye Near:    Bilateral Near:     Physical Exam Vitals and nursing note reviewed.  Constitutional:      General: She is not in acute distress.    Appearance: She is well-developed.  HENT:     Head: Normocephalic and atraumatic.     Right Ear: Tympanic membrane normal.     Left Ear: Tympanic membrane normal.     Nose: Rhinorrhea present.     Mouth/Throat:      Mouth: Mucous membranes are moist.     Pharynx: Oropharynx is clear.  Cardiovascular:     Rate and Rhythm: Regular rhythm. Tachycardia present.     Heart sounds: Normal heart sounds.  Pulmonary:     Effort: Pulmonary effort is normal. No respiratory distress.     Breath sounds: Normal breath sounds.  Abdominal:     Palpations: Abdomen is soft.     Tenderness: There is no abdominal tenderness.  Musculoskeletal:     Cervical back: Neck supple.  Skin:    General: Skin is warm and dry.  Neurological:     Mental Status: She is alert.  Psychiatric:        Mood and Affect: Mood normal.        Behavior: Behavior normal.     UC Treatments / Results  Labs (all labs ordered are listed, but only abnormal results are displayed) Labs Reviewed  POCT INFLUENZA A/B    EKG   Radiology No results found.  Procedures Procedures (including critical care time)  Medications Ordered in UC Medications - No data to display  Initial Impression / Assessment and Plan / UC Course  I have reviewed the triage vital signs and the nursing notes.  Pertinent labs & imaging results that were available during my care of the patient were reviewed by me and considered in my medical decision making (see chart for details).   Viral illness.  Rapid flu negative.  Patient will contact [facility] to schedule COVID test.  Discussed symptomatic treatment including Tylenol or ibuprofen, rest, hydration.  Instructed patient to follow up with PCP if symptoms are not improving.  Patient agrees to plan of care.    Final Clinical Impressions(s) / UC Diagnoses   Final diagnoses:  Viral illness     Discharge Instructions      Your flu test is negative.    Take Tylenol or ibuprofen as needed for fever or discomfort.  Rest and keep yourself hydrated.    Follow-up with your primary care provider if your symptoms are not improving.         ED Prescriptions   None    PDMP not reviewed this  encounter.   Sharion Balloon, NP  11/07/21 0957 ° °

## 2021-11-09 ENCOUNTER — Encounter: Payer: 59 | Admitting: Internal Medicine

## 2021-11-09 ENCOUNTER — Telehealth: Payer: Self-pay | Admitting: Internal Medicine

## 2021-11-09 NOTE — Telephone Encounter (Signed)
Patient called and said she was tested for covid yesterday and is waiting for results. Her appointment for today at 1:30 has been canceled and Larena Glassman said she will call her to reschedule appointment.

## 2021-11-09 NOTE — Telephone Encounter (Signed)
Appt rescheduled

## 2021-11-15 ENCOUNTER — Other Ambulatory Visit (HOSPITAL_COMMUNITY): Payer: Self-pay

## 2021-11-17 ENCOUNTER — Other Ambulatory Visit (HOSPITAL_COMMUNITY): Payer: Self-pay

## 2021-11-29 ENCOUNTER — Other Ambulatory Visit (HOSPITAL_COMMUNITY): Payer: Self-pay

## 2021-11-29 MED FILL — Diltiazem HCl Coated Beads Cap ER 24HR 240 MG: ORAL | 90 days supply | Qty: 90 | Fill #2 | Status: AC

## 2021-12-13 ENCOUNTER — Encounter: Payer: Self-pay | Admitting: Internal Medicine

## 2021-12-13 ENCOUNTER — Ambulatory Visit: Payer: 59 | Admitting: Internal Medicine

## 2021-12-13 ENCOUNTER — Other Ambulatory Visit: Payer: Self-pay

## 2021-12-13 VITALS — BP 122/76 | HR 110 | Temp 98.3°F | Ht 63.5 in | Wt 125.4 lb

## 2021-12-13 DIAGNOSIS — Z3009 Encounter for other general counseling and advice on contraception: Secondary | ICD-10-CM

## 2021-12-13 DIAGNOSIS — R000 Tachycardia, unspecified: Secondary | ICD-10-CM | POA: Diagnosis not present

## 2021-12-13 DIAGNOSIS — B349 Viral infection, unspecified: Secondary | ICD-10-CM

## 2021-12-13 DIAGNOSIS — Z0000 Encounter for general adult medical examination without abnormal findings: Secondary | ICD-10-CM

## 2021-12-13 NOTE — Progress Notes (Signed)
Patient ID: Karla Hughes, female   DOB: 08/22/1996, 26 y.o.   MRN: [id number]   Subjective:    Patient ID: Karla Hughes, female    DOB: 1996/02/17, 26 y.o.   MRN: [id number]  This visit occurred during the SARS-CoV-2 public health emergency.  Safety protocols were in place, including screening questions prior to the visit, additional usage of staff PPE, and extensive cleaning of exam room while observing appropriate contact time as indicated for disinfecting solutions.   Patient here for her physical exam.   Chief Complaint  Patient presents with   Annual Exam    Physical    .   HPI Reports she is doing relatively well.  Planning to get married in 04/2022.  Viral illness 12/13/222.  Better.  No residual problems.  No increased cough or congestion reported.  Evaluated by cardiology 07/2021 - for chest pain and f/u palpitations.  Felt pain likely msk.  Ordered stress test.  [place] reports she is doing well.  No chest pain or sob reported.  No increased heart rate or palpitations.  On diltiazem.  Eating.  Bowels stable.     Past Medical History:  Diagnosis Date   Allergy    Headache    Past Surgical History:  Procedure Laterality Date   MYRINGOTOMY WITH TUBE PLACEMENT     x 2   Family History  Problem Relation Age of Onset   Multiple sclerosis Mother    Breast cancer Other        grandmother   Multiple sclerosis Other        mother   Heart disease Other        grandparent   Multiple sclerosis Maternal Uncle    Social History   Socioeconomic History   Marital status: Single    Spouse name: Not on file   Number of children: Not on file   Years of education: Not on file   Highest education level: Not on file  Occupational History   Not on file  Tobacco Use   Smoking status: Never   Smokeless tobacco: Never  Vaping Use   Vaping Use: Never used  Substance and Sexual Activity   Alcohol use: No    Alcohol/week: 0.0 standard drinks   Drug use: No   Sexual activity:  Never    Birth control/protection: Abstinence  Other Topics Concern   Not on file  Social History Narrative   Not on file   Social Determinants of Health   Financial Resource Strain: Not on file  Food Insecurity: Not on file  Transportation Needs: Not on file  Physical Activity: Not on file  Stress: Not on file  Social Connections: Not on file     Review of Systems  Constitutional:  Negative for appetite change and unexpected weight change.  HENT:  Negative for congestion, sinus pressure and sore throat.   Eyes:  Negative for pain and visual disturbance.  Respiratory:  Negative for cough, chest tightness and shortness of breath.   Cardiovascular:  Negative for chest pain and palpitations.  Gastrointestinal:  Negative for abdominal pain, diarrhea, nausea and vomiting.  Genitourinary:  Negative for difficulty urinating and dysuria.  Musculoskeletal:  Negative for joint swelling and myalgias.  Skin:  Negative for color change and rash.  Neurological:  Negative for dizziness, light-headedness and headaches.  Hematological:  Negative for adenopathy. Does not bruise/bleed easily.  Psychiatric/Behavioral:  Negative for agitation and dysphoric mood.       Objective:  BP 122/76 (BP Location: Left Arm, Patient Position: Sitting, Cuff Size: Normal)    Pulse (!) 110    Temp 98.3 F (36.8 C) (Oral)    Ht 5' 3.5" (1.613 m)    Wt 125 lb 6.4 oz (56.9 kg)    SpO2 99%    BMI 21.87 kg/m  Wt Readings from Last 3 Encounters:  12/13/21 125 lb 6.4 oz (56.9 kg)  08/08/21 132 lb 3.2 oz (60 kg)  12/25/20 131 lb (59.4 kg)    Physical Exam Vitals reviewed.  Constitutional:      General: She is not in acute distress.    Appearance: Normal appearance. She is well-developed.  HENT:     Head: Normocephalic and atraumatic.     Right Ear: External ear normal.     Left Ear: External ear normal.  Eyes:     General: No scleral icterus.       Right eye: No discharge.        Left eye: No  discharge.     Conjunctiva/sclera: Conjunctivae normal.  Neck:     Thyroid: No thyromegaly.  Cardiovascular:     Rate and Rhythm: Normal rate and regular rhythm.  Pulmonary:     Effort: No tachypnea, accessory muscle usage or respiratory distress.     Breath sounds: Normal breath sounds. No decreased breath sounds or wheezing.  Chest:  Breasts:    Right: No inverted nipple, mass, nipple discharge or tenderness (no axillary adenopathy).     Left: No inverted nipple, mass, nipple discharge or tenderness (no axilarry adenopathy).  Abdominal:     General: Bowel sounds are normal.     Palpations: Abdomen is soft.     Tenderness: There is no abdominal tenderness.  Musculoskeletal:        General: No swelling or tenderness.     Cervical back: Neck supple.  Lymphadenopathy:     Cervical: No cervical adenopathy.  Skin:    Findings: No erythema or rash.  Neurological:     Mental Status: She is alert and oriented to person, place, and time.  Psychiatric:        Mood and Affect: Mood normal.        Behavior: Behavior normal.     Outpatient Encounter Medications as of 12/13/2021  Medication Sig   BLISOVI FE 1/20 1-20 MG-MCG tablet TAKE 1 TABLET BY MOUTH DAILY   diltiazem (CARDIZEM CD) 240 MG 24 hr capsule TAKE 1 CAPSULE BY MOUTH ONCE A DAY   No facility-administered encounter medications on file as of 12/13/2021.     Lab Results  Component Value Date   WBC 6.0 08/08/2021   HGB 15.2 08/08/2021   HCT 44.9 08/08/2021   PLT 284 08/08/2021   GLUCOSE 86 08/08/2021   CHOL 141 11/15/2020   TRIG 95.0 11/15/2020   HDL 45.30 11/15/2020   LDLCALC 76 11/15/2020   ALT 10 11/15/2020   AST 14 11/15/2020   NA 142 08/08/2021   K 4.5 08/08/2021   CL 105 08/08/2021   CREATININE 0.80 08/08/2021   BUN 11 08/08/2021   CO2 22 08/08/2021   TSH 1.150 08/08/2021   HGBA1C 5.0 03/19/2017       Assessment & Plan:   Problem List Items Addressed This Visit     Birth control counseling    Taking  ocp's now.  Discussed.  Planning to get married 04/2022.  Consider gyn referral pending type of birth control.        Healthcare  maintenance    Physical today 12/13/21.  PAP 11/08/20 - negative with negative HPV.        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  On diltiazem 240mg  q day.  Doing well on this medication.  Follow.        Viral syndrome    No symptom now.  Follow.          Einar Pheasant, MD

## 2021-12-17 ENCOUNTER — Encounter: Payer: Self-pay | Admitting: Internal Medicine

## 2021-12-17 NOTE — Assessment & Plan Note (Signed)
Diagnosed by cardiology.  On diltiazem 240mg  q day.  Doing well on this medication.  Follow.

## 2021-12-17 NOTE — Assessment & Plan Note (Signed)
Taking ocp's now.  Discussed.  Planning to get married 04/2022.  Consider gyn referral pending type of birth control.

## 2021-12-17 NOTE — Assessment & Plan Note (Signed)
Physical today 12/13/21.  PAP 11/08/20 - negative with negative HPV.

## 2021-12-17 NOTE — Assessment & Plan Note (Signed)
No symptom now.  Follow.

## 2021-12-21 ENCOUNTER — Telehealth: Payer: Self-pay | Admitting: Internal Medicine

## 2021-12-21 DIAGNOSIS — Z124 Encounter for screening for malignant neoplasm of cervix: Secondary | ICD-10-CM

## 2021-12-21 DIAGNOSIS — Z0000 Encounter for general adult medical examination without abnormal findings: Secondary | ICD-10-CM

## 2021-12-21 NOTE — Telephone Encounter (Signed)
Patient called in regards to appt she had previously with PCP 1/18. Patient was advised she would be referred to an OBGYN. Patient also stated she Dr. Nicki Reaper had a recommendation however patient can't remember who the doctor was. Patient would still like to be referred to [facility]

## 2021-12-25 NOTE — Telephone Encounter (Signed)
I can place referral- just need to know where we are sending her

## 2021-12-25 NOTE — Addendum Note (Signed)
Addended by: Lars Masson on: 12/25/2021 03:02 PM   Modules accepted: Orders

## 2021-12-25 NOTE — Telephone Encounter (Signed)
Referral placed. Discussed with Dr Nicki Reaper. LM for patient

## 2021-12-25 NOTE — Telephone Encounter (Signed)
Ok to place referral.

## 2022-01-03 ENCOUNTER — Encounter: Payer: Self-pay | Admitting: Internal Medicine

## 2022-01-03 DIAGNOSIS — D485 Neoplasm of uncertain behavior of skin: Secondary | ICD-10-CM | POA: Diagnosis not present

## 2022-01-05 NOTE — Telephone Encounter (Signed)
Patient called in returning the call to Larena Glassman, please call patient @ [phone]

## 2022-01-05 NOTE — Telephone Encounter (Signed)
LMTCB. Needs appt.

## 2022-01-07 ENCOUNTER — Telehealth: Payer: 59 | Admitting: Physician Assistant

## 2022-01-07 DIAGNOSIS — R591 Generalized enlarged lymph nodes: Secondary | ICD-10-CM | POA: Diagnosis not present

## 2022-01-07 DIAGNOSIS — J340 Abscess, furuncle and carbuncle of nose: Secondary | ICD-10-CM | POA: Diagnosis not present

## 2022-01-07 MED ORDER — DOXYCYCLINE HYCLATE 100 MG PO TABS: 100.0000 mg | ORAL_TABLET | Freq: Two times a day (BID) | ORAL | 0 refills | Status: DC

## 2022-01-07 NOTE — Progress Notes (Signed)
Virtual Visit Consent   Karla Hughes, you are scheduled for a virtual visit with a [facility] provider today.     Just as with appointments in the office, your consent must be obtained to participate.  Your consent will be active for this visit and any virtual visit you may have with one of our providers in the next 365 days.     If you have a MyChart account, a copy of this consent can be sent to you electronically.  All virtual visits are billed to your insurance company just like a traditional visit in the office.    As this is a virtual visit, video technology does not allow for your provider to perform a traditional examination.  This may limit your provider's ability to fully assess your condition.  If your provider identifies any concerns that need to be evaluated in person or the need to arrange testing (such as labs, EKG, etc.), we will make arrangements to do so.     Although advances in technology are sophisticated, we cannot ensure that it will always work on either your end or our end.  If the connection with a video visit is poor, the visit may have to be switched to a telephone visit.  With either a video or telephone visit, we are not always able to ensure that we have a secure connection.     I need to obtain your verbal consent now.   Are you willing to proceed with your visit today?    Karla Hughes has provided verbal consent on 01/07/2022 for a virtual visit (video or telephone).   Mar Daring, PA-C   Date: 01/07/2022 2:09 PM   Virtual Visit via Video Note   I, Mar Daring, connected with  Karla Hughes  ([id number], 11-09-1996) on 01/07/22 at  1:45 PM EST by a video-enabled telemedicine application and verified that I am speaking with the correct person using two identifiers.  Location: Patient: Virtual Visit Location Patient: Home Provider: Virtual Visit Location Provider: Home Office   I discussed the limitations of evaluation and  management by telemedicine and the availability of in person appointments. The patient expressed understanding and agreed to proceed.    History of Present Illness: Karla Hughes is a 26 y.o. who identifies as a female who was assigned female at birth, and is being seen today for swollen lymph node on right side of neck. Bridge of nose is red and swollen. Feels like an infection in the bridge of the nose. Noticed the lymph node swelling on Wednesday night with mild tenderness. Lymph node was first sign, then progressed to have the nasal symptoms. Does report some associated mild rhinorrhea and sneezing, but though was more allergies. Denies fevers, chills, nausea, vomiting. No visual changes.    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   Family history of multiple sclerosis 06/22/2016   Insomnia 06/22/2016   Viral syndrome 03/19/2016   Fatigue 03/06/2016   Lymphadenopathy 03/06/2016   Environmental allergies 03/06/2016   Headache 03/06/2016   Birth control counseling 03/06/2016    Allergies: No Known Allergies Medications:  Current Outpatient Medications:    doxycycline (VIBRA-TABS) 100 MG tablet, Take 1 tablet (100 mg total) by mouth 2 (  two) times daily., Disp: 20 tablet, Rfl: 0   BLISOVI FE 1/20 1-20 MG-MCG tablet, TAKE 1 TABLET BY MOUTH DAILY, Disp: 28 tablet, Rfl: 11   diltiazem (CARDIZEM CD) 240 MG 24 hr capsule, TAKE 1 CAPSULE BY MOUTH ONCE A DAY, Disp: 90 capsule, Rfl: 3  Observations/Objective: Patient is well-developed, well-nourished in no acute distress.  Resting comfortably at home.  Head is normocephalic, atraumatic.  No labored breathing.  Speech is clear and coherent with logical content.  Patient is alert and  oriented at baseline.  Bridge of nose dead center is red and mildly swollen, she reports mild TTP Reports swollen lymph node in right anterior chain; mild TTP  Assessment and Plan: 1. Nasal abscess - doxycycline (VIBRA-TABS) 100 MG tablet; Take 1 tablet (100 mg total) by mouth 2 (two) times daily.  Dispense: 20 tablet; Refill: 0  2. Lymphadenopathy  - Suspect reactive lymph node from possible nasal abscess/infection - Doxycycline prescribed - Tylenol as needed - Warm compresses as needed - Seek in person evaluation if symptoms fail to improve or worsen  Follow Up Instructions: I discussed the assessment and treatment plan with the patient. The patient was provided an opportunity to ask questions and all were answered. The patient agreed with the plan and demonstrated an understanding of the instructions.  A copy of instructions were sent to the patient via MyChart unless otherwise noted below.   The patient was advised to call back or seek an in-person evaluation if the symptoms worsen or if the condition fails to improve as anticipated.  Time:  I spent 12 minutes with the patient via telehealth technology discussing the above problems/concerns.    Mar Daring, PA-C

## 2022-01-07 NOTE — Patient Instructions (Signed)
Roni Bread Disbrow, thank you for joining Mar Daring, PA-C for today's virtual visit.  While this provider is not your primary care provider (PCP), if your PCP is located in our provider database this encounter information will be shared with them immediately following your visit.  Consent: (Patient) Karla Hughes provided verbal consent for this virtual visit at the beginning of the encounter.  Current Medications:  Current Outpatient Medications:    doxycycline (VIBRA-TABS) 100 MG tablet, Take 1 tablet (100 mg total) by mouth 2 (two) times daily., Disp: 20 tablet, Rfl: 0   BLISOVI FE 1/20 1-20 MG-MCG tablet, TAKE 1 TABLET BY MOUTH DAILY, Disp: 28 tablet, Rfl: 11   diltiazem (CARDIZEM CD) 240 MG 24 hr capsule, TAKE 1 CAPSULE BY MOUTH ONCE A DAY, Disp: 90 capsule, Rfl: 3   Medications ordered in this encounter:  Meds ordered this encounter  Medications   doxycycline (VIBRA-TABS) 100 MG tablet    Sig: Take 1 tablet (100 mg total) by mouth 2 (two) times daily.    Dispense:  20 tablet    Refill:  0    Order Specific Question:   Supervising Provider    Answer:   [name] Heck, BRIAN [[id number]]     *If you need refills on other medications prior to your next appointment, please contact your pharmacy*  Follow-Up: Call back or seek an in-person evaluation if the symptoms worsen or if the condition fails to improve as anticipated.  Other Instructions Lymphadenopathy Lymphadenopathy means that your lymph glands are swollen or larger than normal. Lymph glands, also called lymph nodes, are collections of tissue that filter excess fluid, bacteria, viruses, and waste from your bloodstream. They are part of your body's disease-fighting system (immune system), which protects your body from germs. There may be different causes of lymphadenopathy, depending on where it is in your body. Some types go away on their own. Lymphadenopathy can occur anywhere that you have lymph glands, including these  areas: Neck (cervical lymphadenopathy). Chest (mediastinal lymphadenopathy). Lungs (hilar lymphadenopathy). Underarms (axillary lymphadenopathy). Groin (inguinal lymphadenopathy). When your immune system responds to germs, infection-fighting cells and fluid build up in your lymph glands. This causes some swelling and enlargement. If the lymph nodes do not go back to normal size after you have an infection or disease, your health care provider may do tests. These tests help to monitor your condition and find the reason why the glands are still swollen and enlarged. Follow these instructions at home:  Get plenty of rest. Your health care provider may recommend over-the-counter medicines for pain. Take over-the-counter and prescription medicines only as told by your health care provider. If directed, apply heat to swollen lymph glands as often as told by your health care provider. Use the heat source that your health care provider recommends, such as a moist heat pack or a heating pad. Place a towel between your skin and the heat source. Leave the heat on for 20-30 minutes. Remove the heat if your skin turns bright red. This is especially important if you are unable to feel pain, heat, or cold. You may have a greater risk of getting burned. Check your affected lymph glands every day for changes. Check other lymph gland areas as told by your health care provider. Check for changes such as: More swelling. Sudden increase in size. Redness or pain. Hardness. Keep all follow-up visits. This is important. Contact a health care provider if you have: Lymph glands that: Are still swollen after 2 weeks.  Have suddenly gotten bigger or the swelling spreads. Are red, painful, or hard. Fluid leaking from the skin near an enlarged lymph gland. Problems with breathing. A fever, chills, or night sweats. Fatigue. A sore throat. Pain in your abdomen. Weight loss. Get help right away if you have: Severe  pain. Chest pain. Shortness of breath. These symptoms may represent a serious problem that is an emergency. Do not wait to see if the symptoms will go away. Get medical help right away. Call your local emergency services (911 in the [place] Do not drive yourself to the hospital. Summary Lymphadenopathy means that your lymph glands are swollen or larger than normal. Lymph glands, also called lymph nodes, are collections of tissue that filter excess fluid, bacteria, viruses, and waste from the bloodstream. They are part of your body's disease-fighting system (immune system). Lymphadenopathy can occur anywhere that you have lymph glands. If the lymph nodes do not go back to normal size after you have an infection or disease, your health care provider may do tests to monitor your condition and find the reason why the glands are still swollen and enlarged. Check your affected lymph glands every day for changes. Check other lymph gland areas as told by your health care provider. This information is not intended to replace advice given to you by your health care provider. Make sure you discuss any questions you have with your health care provider. Document Revised: 09/07/2020 Document Reviewed: 09/07/2020 Elsevier Patient Education  2022 [employer].   Nasal Abscess A nasal abscess is an infection that causes pus to build up between the bone or cartilage that separates one side of the nose from the other (nasal septum). The front part of the nasal septum is cartilage, and the back part is bone. A nasal abscess causes pain and stuffiness (nasal congestion). It can affect one or both sides of the nose. This condition is dangerous because the infection can spread to the brain or the eye. It can also cause the septum to collapse and result in a deformed nose (saddle nose). Treatment for a nasal abscess needs to start as soon as the diagnosis is made. What are the causes? This condition is caused by an  infection with bacteria. A common type of skin bacteria (staphylococcus) is the most common cause, but other bacteria may also cause the infection. A nasal abscess may result from: An injury to the nasal septum that causes bleeding between the covering layer of the septal cartilage or the bony septum (septal hematoma). Bacteria from inside the nose can enter the hematoma and cause the infection that leads to an abscess. An object inserted into the nose (foreign body). An infection that spreads to the septum from another area of the body, such as a sinus infection, dental infection, or an infected hair follicle in the nose (furuncle). What increases the risk? You are more likely to develop this condition if: You have diabetes. Your body's defense system (immune system) is weak. You have nasal surgery. What are the signs or symptoms? Symptoms of this condition include: Not being able to breathe through your nose (nasal obstruction). This is the main symptom. Nose pain. Swelling and redness of the outer nose. Pain when touching the nose (tenderness). Fever. Headache. How is this diagnosed? This condition may be diagnosed based on: Your symptoms and medical history. A physical exam. During the exam, your health care provider will use an instrument (nasal speculum) to look inside your nose and check for a  soft red or blue swelling that is blocking one or both sides of your nose. Tests, such as: CT scans of the nose and sinuses. Blood tests to check for signs of infection. A lab test on a sample of pus to check what kind of bacteria is causing the infection and determine what antibiotic medicine will be most effective (culture and sensitivity test). How is this treated? Treatment needs to start right away to prevent complications. Treatment may include: Antibiotic medicine given through an IV inserted into one of your veins. You may be started on antibiotics that are effective against  staphylococcus and other common bacteria (broad spectrum antibiotics). You may also get a combination of antibiotics. A procedure to drain the abscess surgically (incision and drainage) and test the pus for bacteria. The pus may be sent for a culture and sensitivity test. You may have a type of bandage (packing) placed in your nose after the abscess is drained. Antibiotics to be taken by mouth (orally). You may be sent home with this medicine after the abscess is drained and the infection is controlled with IV antibiotics. Your antibiotic may be switched if the results of your culture and sensitivity test indicate another antibiotic would be more effective. Follow these instructions at home:  Take your antibiotic medicine as told by your health care provider. Do not stop taking the antibiotic even if you start to feel better. Take over-the-counter and prescription medicines only as told by your health care provider. Do not drive or use heavy machinery while taking prescription pain medicine. Return to your normal activities as told by your health care provider. Ask your health care provider what activities are safe for you. If you have a nasal packing, ask your health care provider when you should come back to have the packing removed. Keep all follow-up visits as told by your health care provider. This is important. Contact a health care provider if: You have chills or a fever. You have any signs or symptoms of the nasal abscess coming back. You have any blood or pus draining from your nose. You develop a deformity of your nose. Get help right away if: You have a severe headache, stiff neck, or eye pain. You have a sudden change in vision. Summary A nasal abscess is an infection that causes a collection of pus near the bone or cartilage that separates one side of the nose from the other (nasal septum). A nasal abscess may result from an injury to the septum that caused bleeding that collected  (hematoma) and became infected. Common symptoms of this condition include nasal pain and not being able to breathe through your nose. Treatment needs to be started right away to prevent complications. Treatment may include IV antibiotics as well as incision and drainage. Get help right away if you have a severe headache, stiff neck, eye pain, or a sudden change in vision. This information is not intended to replace advice given to you by your health care provider. Make sure you discuss any questions you have with your health care provider. Document Revised: 03/06/2019 Document Reviewed: 05/20/2018 Elsevier Patient Education  2022 [employer].    If you have been instructed to have an in-person evaluation today at a local Urgent Care facility, please use the link below. It will take you to a list of all of our available [facility], including address, phone number and hours of operation. Please do not delay care.  [facility]  If  you or a family member do not have a primary care provider, use the link below to schedule a visit and establish care. When you choose a [facility] primary care physician or advanced practice provider, you gain a long-term partner in health. Find a Primary Care Provider  Learn more about [facility]'s in-office and virtual care options: [facility]

## 2022-01-08 ENCOUNTER — Encounter: Payer: Self-pay | Admitting: Internal Medicine

## 2022-01-10 ENCOUNTER — Ambulatory Visit: Payer: 59 | Admitting: Adult Health

## 2022-02-09 ENCOUNTER — Ambulatory Visit: Payer: 59 | Admitting: Cardiology

## 2022-02-14 ENCOUNTER — Other Ambulatory Visit: Payer: Self-pay

## 2022-02-14 ENCOUNTER — Other Ambulatory Visit: Payer: Self-pay | Admitting: Obstetrics

## 2022-02-14 ENCOUNTER — Ambulatory Visit: Payer: 59 | Admitting: Obstetrics

## 2022-02-14 VITALS — BP 146/79 | HR 111 | Ht 64.0 in | Wt 128.0 lb

## 2022-02-14 DIAGNOSIS — Z30017 Encounter for initial prescription of implantable subdermal contraceptive: Secondary | ICD-10-CM

## 2022-02-14 DIAGNOSIS — Z3202 Encounter for pregnancy test, result negative: Secondary | ICD-10-CM

## 2022-02-14 LAB — POCT URINE PREGNANCY: Preg Test, Ur: NEGATIVE

## 2022-02-14 NOTE — Progress Notes (Signed)
ENCOUNTER FOR NEXPLANON INSERTION ? ?SUBJECTIVE ?Karla Hughes is a 26 y.o. No obstetric history on file. who presents today for insertion of Nexplanon contraceptive device. She is currently using OCPs. She desires reversible long-term contraception. We have thoroughly reviewed the risks, benefits, and alternatives, and she has elected to proceed with Nexplanon insertion. ? ?OBJECTIVE ?BP (!) 146/79   Pulse (!) 111   Ht '5\' 4"'$  (1.626 m)   Wt 128 lb (58.1 kg)   LMP 01/31/2022   BMI 21.97 kg/m?  ? ?UPT: negative ? ?Procedure Note ?Right Arm ?Sterile Preparation:  Betadine ? ?Insertion site was selected 8 cm from the medial epicondyle and marked using a sterile marker. The procedure area was prepped in sterile fashion. Adequate anesthesia was achieved with 2 mL of 1% lidocaine injected subcutaneously. The Nexplanon applicator was inserted subcutaneously and the Nexplanon device was delivered. The applicator was removed from the insertion site. The capsule was palpated by myself and the patient to confirm satisfactory placement. Blood loss was minimal. A pressure dressing was applied. ? ?The patient tolerated the procedure well with no complications. Standard post-procedure care and return precautions were explained.  ? ?Lloyd Huger, CNM  ?

## 2022-06-06 ENCOUNTER — Ambulatory Visit: Payer: 59 | Admitting: Cardiology

## 2022-12-14 ENCOUNTER — Encounter: Payer: 59 | Admitting: Internal Medicine

## 2023-01-18 ENCOUNTER — Encounter: Payer: Commercial Managed Care - PPO | Admitting: Internal Medicine

## 2023-02-13 ENCOUNTER — Other Ambulatory Visit (HOSPITAL_COMMUNITY)
Admission: RE | Admit: 2023-02-13 | Discharge: 2023-02-13 | Disposition: A | Discharge status: Home / Self Care | Payer: Commercial Managed Care - PPO | Source: Ambulatory Visit | Attending: Internal Medicine | Admitting: Internal Medicine

## 2023-02-13 ENCOUNTER — Ambulatory Visit (INDEPENDENT_AMBULATORY_CARE_PROVIDER_SITE_OTHER): Payer: Commercial Managed Care - PPO | Admitting: Internal Medicine

## 2023-02-13 ENCOUNTER — Encounter: Payer: Self-pay | Admitting: Internal Medicine

## 2023-02-13 VITALS — BP 126/80 | HR 90 | Temp 98.0°F | Resp 16 | Ht 63.0 in | Wt 126.4 lb

## 2023-02-13 DIAGNOSIS — Z13220 Encounter for screening for lipoid disorders: Secondary | ICD-10-CM

## 2023-02-13 DIAGNOSIS — Z124 Encounter for screening for malignant neoplasm of cervix: Secondary | ICD-10-CM | POA: Diagnosis not present

## 2023-02-13 DIAGNOSIS — Z87898 Personal history of other specified conditions: Secondary | ICD-10-CM | POA: Insufficient documentation

## 2023-02-13 DIAGNOSIS — Z0000 Encounter for general adult medical examination without abnormal findings: Secondary | ICD-10-CM | POA: Diagnosis not present

## 2023-02-13 DIAGNOSIS — Z803 Family history of malignant neoplasm of breast: Secondary | ICD-10-CM | POA: Diagnosis not present

## 2023-02-13 DIAGNOSIS — I4711 Inappropriate sinus tachycardia, so stated: Secondary | ICD-10-CM

## 2023-02-13 LAB — COMPREHENSIVE METABOLIC PANEL
ALT: 9 U/L (ref 0–35)
AST: 16 U/L (ref 0–37)
Albumin: 4.8 g/dL (ref 3.5–5.2)
Alkaline Phosphatase: 80 U/L (ref 39–117)
BUN: 14 mg/dL (ref 6–23)
CO2: 27 mEq/L (ref 19–32)
Calcium: 10.2 mg/dL (ref 8.4–10.5)
Chloride: 104 mEq/L (ref 96–112)
Creatinine, Ser: 0.67 mg/dL (ref 0.40–1.20)
GFR: 121.76 mL/min (ref >60.00)
Glucose, Bld: 90 mg/dL (ref 70–99)
Potassium: 3.9 mEq/L (ref 3.5–5.1)
Sodium: 140 mEq/L (ref 135–145)
Total Bilirubin: 0.9 mg/dL (ref 0.2–1.2)
Total Protein: 7.9 g/dL (ref 6.0–8.3)

## 2023-02-13 LAB — CBC WITH DIFFERENTIAL/PLATELET
Basophils Absolute: 0 10*3/uL (ref 0.0–0.1)
Basophils Relative: 0.3 % (ref 0.0–3.0)
Eosinophils Absolute: 0 10*3/uL (ref 0.0–0.7)
Eosinophils Relative: 0.7 % (ref 0.0–5.0)
HCT: 43.9 % (ref 36.0–46.0)
Hemoglobin: 15.1 g/dL — ABNORMAL HIGH (ref 12.0–15.0)
Lymphocytes Relative: 31.3 % (ref 12.0–46.0)
Lymphs Abs: 2 10*3/uL (ref 0.7–4.0)
MCHC: 34.4 g/dL (ref 30.0–36.0)
MCV: 88.1 fl (ref 78.0–100.0)
Monocytes Absolute: 0.4 10*3/uL (ref 0.1–1.0)
Monocytes Relative: 6.3 % (ref 3.0–12.0)
Neutro Abs: 3.9 10*3/uL (ref 1.4–7.7)
Neutrophils Relative %: 61.4 % (ref 43.0–77.0)
Platelets: 247 10*3/uL (ref 150.0–400.0)
RBC: 4.98 Mil/uL (ref 3.87–5.11)
RDW: 12.7 % (ref 11.5–15.5)
WBC: 6.4 10*3/uL (ref 4.0–10.5)

## 2023-02-13 LAB — LIPID PANEL
Cholesterol: 146 mg/dL (ref 0–200)
HDL: 47.3 mg/dL (ref >39.00)
LDL Cholesterol: 88 mg/dL (ref 0–99)
NonHDL: 98.99
Total CHOL/HDL Ratio: 3
Triglycerides: 56 mg/dL (ref 0.0–149.0)
VLDL: 11.2 mg/dL (ref 0.0–40.0)

## 2023-02-13 LAB — TSH: TSH: 0.8 u[IU]/mL (ref 0.35–5.50)

## 2023-02-13 NOTE — Progress Notes (Signed)
Subjective:    Patient ID: Karla Hughes, female    DOB: 10-03-1996, 27 y.o.   MRN: [id number]  Patient here for  Chief Complaint  Patient presents with   Annual Exam    HPI Here for physical exam. S/p nexplanon insertion 02/14/22.  Reports she is doing relatively well.  Stays active.  No chest pain or sob reported.  No abdominal pain or bowel change reported. Discussed family history of breast cancer and melanoma.    Past Medical History:  Diagnosis Date   Allergy    Headache    Past Surgical History:  Procedure Laterality Date   MYRINGOTOMY WITH TUBE PLACEMENT     x 2   Family History  Problem Relation Age of Onset   Multiple sclerosis Mother    Breast cancer Other        grandmother   Multiple sclerosis Other        mother   Heart disease Other        grandparent   Multiple sclerosis Maternal Uncle    Social History   Socioeconomic History   Marital status: Married    Spouse name: Not on file   Number of children: Not on file   Years of education: Not on file   Highest education level: Not on file  Occupational History   Not on file  Tobacco Use   Smoking status: Never   Smokeless tobacco: Never  Vaping Use   Vaping Use: Never used  Substance and Sexual Activity   Alcohol use: No    Alcohol/week: 0.0 standard drinks of alcohol   Drug use: No   Sexual activity: Never    Birth control/protection: Abstinence  Other Topics Concern   Not on file  Social History Narrative   Not on file   Social Determinants of Health   Financial Resource Strain: Not on file  Food Insecurity: Not on file  Transportation Needs: Not on file  Physical Activity: Not on file  Stress: Not on file  Social Connections: Not on file     Review of Systems  Constitutional:  Negative for appetite change and unexpected weight change.  HENT:  Negative for congestion, sinus pressure and sore throat.   Eyes:  Negative for pain and visual disturbance.  Respiratory:  Negative  for cough, chest tightness and shortness of breath.   Cardiovascular:  Negative for chest pain and palpitations.  Gastrointestinal:  Negative for abdominal pain, diarrhea, nausea and vomiting.  Genitourinary:  Negative for difficulty urinating and dysuria.  Musculoskeletal:  Negative for joint swelling and myalgias.  Skin:  Negative for color change and rash.  Neurological:  Negative for dizziness and headaches.  Hematological:  Negative for adenopathy. Does not bruise/bleed easily.  Psychiatric/Behavioral:  Negative for agitation and dysphoric mood.        Objective:     BP 126/80   Pulse 90   Temp 98 F (36.7 C)   Resp 16   Ht 5\' 3"  (1.6 m)   Wt 126 lb 6.4 oz (57.3 kg)   SpO2 98%   BMI 22.39 kg/m  Wt Readings from Last 3 Encounters:  02/13/23 126 lb 6.4 oz (57.3 kg)  02/14/22 128 lb (58.1 kg)  12/13/21 125 lb 6.4 oz (56.9 kg)    Physical Exam Vitals reviewed.  Constitutional:      General: She is not in acute distress.    Appearance: Normal appearance. She is well-developed.  HENT:     Head:  Normocephalic and atraumatic.     Right Ear: External ear normal.     Left Ear: External ear normal.  Eyes:     General: No scleral icterus.       Right eye: No discharge.        Left eye: No discharge.     Conjunctiva/sclera: Conjunctivae normal.  Neck:     Thyroid: No thyromegaly.  Cardiovascular:     Rate and Rhythm: Normal rate and regular rhythm.  Pulmonary:     Effort: No tachypnea, accessory muscle usage or respiratory distress.     Breath sounds: Normal breath sounds. No decreased breath sounds or wheezing.  Chest:  Breasts:    Right: No inverted nipple, mass, nipple discharge or tenderness (no axillary adenopathy).     Left: No inverted nipple, mass, nipple discharge or tenderness (no axilarry adenopathy).  Abdominal:     General: Bowel sounds are normal.     Palpations: Abdomen is soft.     Tenderness: There is no abdominal tenderness.  Genitourinary:     Comments: Normal external genitalia.  Vaginal vault without lesions.  Cervix identified.  Pap smear performed.  Could not appreciate any adnexal masses or tenderness.   Musculoskeletal:        General: No swelling or tenderness.     Cervical back: Neck supple.  Lymphadenopathy:     Cervical: No cervical adenopathy.  Skin:    Findings: No erythema or rash.  Neurological:     Mental Status: She is alert and oriented to person, place, and time.  Psychiatric:        Mood and Affect: Mood normal.        Behavior: Behavior normal.      Outpatient Encounter Medications as of 02/13/2023  Medication Sig   [DISCONTINUED] BLISOVI FE 1/20 1-20 MG-MCG tablet TAKE 1 TABLET BY MOUTH DAILY   [DISCONTINUED] diltiazem (CARDIZEM CD) 240 MG 24 hr capsule TAKE 1 CAPSULE BY MOUTH ONCE A DAY   [DISCONTINUED] doxycycline (VIBRA-TABS) 100 MG tablet Take 1 tablet (100 mg total) by mouth 2 (two) times daily.   No facility-administered encounter medications on file as of 02/13/2023.     Lab Results  Component Value Date   WBC 6.4 02/13/2023   HGB 15.1 (H) 02/13/2023   HCT 43.9 02/13/2023   PLT 247.0 02/13/2023   GLUCOSE 90 02/13/2023   CHOL 146 02/13/2023   TRIG 56.0 02/13/2023   HDL 47.30 02/13/2023   LDLCALC 88 02/13/2023   ALT 9 02/13/2023   AST 16 02/13/2023   NA 140 02/13/2023   K 3.9 02/13/2023   CL 104 02/13/2023   CREATININE 0.67 02/13/2023   BUN 14 02/13/2023   CO2 27 02/13/2023   TSH 0.80 02/13/2023   HGBA1C 5.0 03/19/2017       Assessment & Plan:  Routine general medical examination at a health care facility  Healthcare maintenance Assessment & Plan: Physical today 02/13/23.  PAP 11/08/20 - negative with negative HPV.   PAP today.     Screening for cervical cancer -     Cytology - PAP  History of tachycardia -     CBC with Differential/Platelet -     Comprehensive metabolic panel -     TSH  Screening cholesterol level -     Lipid panel  Family history of breast  cancer Assessment & Plan: Discussed.  Family history of breast cancer and melanoma. Refer for genetic counseling.   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  Was on diltiazem.  Off now.  Doing well off.  Follow.       Einar Pheasant, MD

## 2023-02-13 NOTE — Assessment & Plan Note (Addendum)
Physical today 02/13/23.  PAP 11/08/20 - negative with negative HPV.   PAP today.

## 2023-02-18 LAB — CYTOLOGY - PAP
Comment: NEGATIVE
Diagnosis: NEGATIVE
High risk HPV: NEGATIVE

## 2023-02-24 ENCOUNTER — Encounter: Payer: Self-pay | Admitting: Internal Medicine

## 2023-02-24 NOTE — Assessment & Plan Note (Signed)
Diagnosed by cardiology.  Was on diltiazem.  Off now.  Doing well off.  Follow.

## 2023-02-24 NOTE — Assessment & Plan Note (Addendum)
Discussed.  Family history of breast cancer and melanoma. Refer for genetic counseling.

## 2023-03-06 ENCOUNTER — Encounter: Payer: Self-pay | Admitting: Licensed Clinical Social Worker

## 2023-03-06 ENCOUNTER — Inpatient Hospital Stay: Payer: Commercial Managed Care - PPO

## 2023-03-06 ENCOUNTER — Inpatient Hospital Stay: Payer: Commercial Managed Care - PPO | Attending: Oncology | Admitting: Licensed Clinical Social Worker

## 2023-03-06 DIAGNOSIS — Z8041 Family history of malignant neoplasm of ovary: Secondary | ICD-10-CM

## 2023-03-06 DIAGNOSIS — Z800 Family history of malignant neoplasm of digestive organs: Secondary | ICD-10-CM

## 2023-03-06 DIAGNOSIS — Z803 Family history of malignant neoplasm of breast: Secondary | ICD-10-CM | POA: Diagnosis not present

## 2023-03-06 DIAGNOSIS — Z8042 Family history of malignant neoplasm of prostate: Secondary | ICD-10-CM | POA: Diagnosis not present

## 2023-03-06 DIAGNOSIS — Z801 Family history of malignant neoplasm of trachea, bronchus and lung: Secondary | ICD-10-CM | POA: Diagnosis not present

## 2023-03-06 NOTE — Progress Notes (Signed)
REFERRING PROVIDER: Dale West Ishpeming, MD [address] [place],  [place] [address]  PRIMARY PROVIDER:  Dale Winfield, MD  PRIMARY REASON FOR VISIT:  1. Family history of breast cancer   2. Family history of prostate cancer   3. Family history of ovarian cancer   4. Family history of lung cancer   5. Family history of colon cancer      HISTORY OF PRESENT ILLNESS:   Karla Hughes, a 27 y.o. female, was seen for a [facility] cancer genetics consultation at the request of Dr. Lorin Picket due to a family history of cancer.  Karla Hughes presents to clinic today to discuss the possibility of a hereditary predisposition to cancer, genetic testing, and to further clarify her future cancer risks, as well as potential cancer risks for family members.   CANCER HISTORY:   Karla Hughes is a 27 y.o. female with no personal history of cancer.    RISK FACTORS:  Menarche was at age 44.  Ovaries intact: yes.  Hysterectomy: no.  Menopausal status: premenopausal.  HRT use: 0 years. Colonoscopy: n/a. Mammogram within the last year: no. Number of breast biopsies: 0. Up to date with pelvic exams: yes.  Past Medical History:  Diagnosis Date   Allergy    Headache     Past Surgical History:  Procedure Laterality Date   MYRINGOTOMY WITH TUBE PLACEMENT     x 2    FAMILY HISTORY:  We obtained a detailed, 4-generation family history.  Significant diagnoses are listed below: Family History  Problem Relation Age of Onset   Multiple sclerosis Mother    Prostate cancer Father 46       Gleason 9   Multiple sclerosis Maternal Uncle    Skin cancer Maternal Grandfather        metastatic SCC   Breast cancer Paternal Grandmother 39   Colon cancer Paternal Grandmother 51   Lung cancer Paternal Grandfather    Breast cancer Other        mat great grandmother   Ovarian cancer Other        pat great aunt   Ovarian cancer Other        pat great grandmother   Karla Hughes has 1 brother,  77, no cancers.  Karla Hughes's mother is living at 48 with MS and 6 meningiomas. Patient's maternal grandfather died of metastatic squamous cell skin cancer. He had two brothers that died of lung cancer and his mother had stomach cancer and meningioma. Patient's maternal grandmother's brother had melanoma and her mother had breast cancer.  Karla Hughes's father had prostate cancer at 71, Gleason 9, and is living at 23. No genetic testing. Paternal grandfather had lung cancer and his mother, patient's great grandmother, had ovarian cancer. Patient's paternal grandmother had breast cancer at 12, colon cancer at 67, and is living at 23. Her sister, patient's great aunt, had fallopian tube cancer, and her brother had lymphoma, father had leukemia.   Karla Hughes is unaware of previous family history of genetic testing for hereditary cancer risks. There is no reported Ashkenazi Jewish ancestry. There is no known consanguinity.    GENETIC COUNSELING ASSESSMENT: Karla Hughes is a 27 y.o. female with a family history of prostate, breast and ovarian cancer which is somewhat suggestive of a hereditary cancer syndrome and predisposition to cancer. We, therefore, discussed and recommended the following at today's visit.   DISCUSSION: We discussed that approximately 10% of cancer is hereditary. Most cases of hereditary breast/prostate/ovarian  cancer are associated with BRCA1/BRCA2 genes, although there are other genes associated with hereditary cancer as well. Cancers and risks are gene specific. We discussed that testing is beneficial for several reasons including knowing about cancer risks, identifying potential screening and risk-reduction options that may be appropriate, and to understand if other family members could be at risk for cancer and allow them to undergo genetic testing.   We reviewed the characteristics, features and inheritance patterns of hereditary cancer syndromes. We also discussed genetic  testing, including the appropriate family members to test, the process of testing, insurance coverage and turn-around-time for results. We discussed the implications of a negative, positive and/or variant of uncertain significant result. We recommended Karla Hughes pursue genetic testing for the [employer] CancerNext-Expanded+RNA gene panel.   Based on Karla Hughes's family history of cancer, she meets medical criteria for genetic testing. Despite that she meets criteria, she may still have an out of pocket cost. We discussed that if her out of pocket cost for testing is over $100, the laboratory will call and confirm whether she wants to proceed with testing.  If the out of pocket cost of testing is less than $100 she will be billed by the genetic testing laboratory.   We discussed that some people do not want to undergo genetic testing due to fear of genetic discrimination.  A federal law called the Genetic Information Non-Discrimination Act (GINA) of 2008 helps protect individuals against genetic discrimination based on their genetic test results.  It impacts both health insurance and employment.  For health insurance, it protects against increased premiums, being kicked off insurance or being forced to take a test in order to be insured.  For employment it protects against hiring, firing and promoting decisions based on genetic test results.  Health status due to a cancer diagnosis is not protected under GINA.  This law does not protect life insurance, disability insurance, or other types of insurance.   PLAN: After considering the risks, benefits, and limitations, Karla Hughes provided informed consent to pursue genetic testing and the blood sample was sent to [facility] for analysis of the CancerNext-Expanded+RNA panel. Results should be available within approximately 2-3 weeks' time, at which point they will be disclosed by telephone to Karla Hughes, as will any additional recommendations warranted by  these results. Karla Hughes will receive a summary of her genetic counseling visit and a copy of her results once available. This information will also be available in Epic.   Karla Hughes's questions were answered to her satisfaction today. Our contact information was provided should additional questions or concerns arise. Thank you for the referral and allowing [place] to share in the care of your patient.   Karla Duverney, MS, [facility] Genetic Counselor [email or web address] Phone: [phone]  The patient was seen for a total of 30 minutes in face-to-face genetic counseling.  Patient was seen with her mother. Dr. Orlie Dakin was available for discussion regarding this case.   _______________________________________________________________________ For Office Staff:  Number of people involved in session: 2 Was an Intern/ student involved with case: no

## 2023-04-04 ENCOUNTER — Telehealth: Payer: Self-pay | Admitting: Licensed Clinical Social Worker

## 2023-04-10 NOTE — Telephone Encounter (Signed)
I contacted Ms. Rolin to discuss her genetic testing results. No pathogenic variants were identified in the 71 genes analyzed. Detailed clinic note to follow.   The test report has been scanned into EPIC and is located under the Molecular Pathology section of the Results Review tab.  A portion of the result report is included below for reference.      Lacy Duverney, MS, [facility] Genetic Counselor [email or web address] Phone: [phone]

## 2023-04-11 ENCOUNTER — Encounter: Payer: Self-pay | Admitting: Licensed Clinical Social Worker

## 2023-04-11 ENCOUNTER — Ambulatory Visit: Payer: Self-pay | Admitting: Licensed Clinical Social Worker

## 2023-04-11 DIAGNOSIS — Z1379 Encounter for other screening for genetic and chromosomal anomalies: Secondary | ICD-10-CM

## 2023-04-11 NOTE — Progress Notes (Signed)
HPI:   Ms. Rojek was previously seen in the [facility] clinic due to a family history of cancer and concerns regarding a hereditary predisposition to cancer. Please refer to our prior cancer genetics clinic note for more information regarding our discussion, assessment and recommendations, at the time. Ms. Risenhoover's recent genetic test results were disclosed to her, as were recommendations warranted by these results. These results and recommendations are discussed in more detail below.  CANCER HISTORY:  Oncology History   No history exists.    FAMILY HISTORY:  We obtained a detailed, 4-generation family history.  Significant diagnoses are listed below: Family History  Problem Relation Age of Onset   Multiple sclerosis Mother    Prostate cancer Father 28       Gleason 9   Multiple sclerosis Maternal Uncle    Skin cancer Maternal Grandfather        metastatic SCC   Breast cancer Paternal Grandmother 25   Colon cancer Paternal Grandmother 25   Lung cancer Paternal Grandfather    Breast cancer Other        mat great grandmother   Ovarian cancer Other        pat great aunt   Ovarian cancer Other        pat great grandmother  Ms. Hiltunen has 1 brother, 46, no cancers.   Ms. Cherubin's mother is living at 67 with MS and 6 meningiomas. Patient's maternal grandfather died of metastatic squamous cell skin cancer. He had two brothers that died of lung cancer and his mother had stomach cancer and meningioma. Patient's maternal grandmother's brother had melanoma and her mother had breast cancer.   Ms. Creek's father had prostate cancer at 79, Gleason 9, and is living at 25. No genetic testing. Paternal grandfather had lung cancer and his mother, patient's great grandmother, had ovarian cancer. Patient's paternal grandmother had breast cancer at 31, colon cancer at 7, and is living at 25. Her sister, patient's great aunt, had fallopian tube cancer, and her brother had lymphoma,  father had leukemia.    Ms. Hard is unaware of previous family history of genetic testing for hereditary cancer risks. There is no reported Ashkenazi Jewish ancestry. There is no known consanguinity.      GENETIC TEST RESULTS:  The [facility] CancerNext-Expanded+RNA Panel found no pathogenic mutations.   The CancerNext-Expanded + RNAinsight gene panel offered by [employer] and includes sequencing and rearrangement analysis for the following 71 genes: AIP, ALK, APC*, ATM*, AXIN2, BAP1, BARD1, BMPR1A, BRCA1*, BRCA2*, BRIP1*, CDC73, CDH1*,CDK4, CDKN1B, CDKN2A, CHEK2*, CTNNA1, DICER1, FH, FLCN, KIF1B, LZTR1, MAX, MEN1, MET, MLH1*, MSH2*, MSH3, MSH6*, MUTYH*, NF1*, NF2, NTHL1, PALB2*, PHOX2B, PMS2*, POT1, PRKAR1A, PTCH1, PTEN*, RAD51C*, RAD51D*,RB1, RET, SDHA, SDHAF2, SDHB, SDHC, SDHD, SMAD4, SMARCA4, SMARCB1, SMARCE1, STK11, SUFU, TMEM127, TP53*,TSC1, TSC2, VHL; EGFR, EGLN1, HOXB13, KIT, MITF, PDGFRA, POLD1 and POLE (sequencing only); EPCAM and GREM1 (deletion/duplication only).   The test report has been scanned into EPIC and is located under the Molecular Pathology section of the Results Review tab.  A portion of the result report is included below for reference. Genetic testing reported out on 04/03/2023.       Even though a pathogenic variant was not identified, possible explanations for the cancer in the family may include: There may be no hereditary risk for cancer in the family. The cancers in Ms. Lukacs and/or her family may be sporadic/familial or due to other genetic and environmental factors. There may be a gene mutation  in one of these genes that current testing methods cannot detect but that chance is small. There could be another gene that has not yet been discovered, or that we have not yet tested, that is responsible for the cancer diagnoses in the family.  It is also possible there is a hereditary cause for the cancer in the family that Ms. Almon did not inherit.  Therefore,  it is important to remain in touch with cancer genetics in the future so that we can continue to offer Ms. Gueye the most up to date genetic testing.   ADDITIONAL GENETIC TESTING:  We discussed with Ms. Holdt that her genetic testing was fairly extensive.  If there are additional relevant genes identified to increase cancer risk that can be analyzed in the future, we would be happy to discuss and coordinate this testing at that time.    CANCER SCREENING RECOMMENDATIONS:  Ms. Lucado's test result is considered negative (normal).  This means that we have not identified a hereditary cause for her family history of cancer at this time.  An individual's cancer risk and medical management are not determined by genetic test results alone. Overall cancer risk assessment incorporates additional factors, including personal medical history, family history, and any available genetic information that may result in a personalized plan for cancer prevention and surveillance. Therefore, it is recommended she continue to follow the cancer management and screening guidelines provided by her  primary healthcare provider.  RECOMMENDATIONS FOR FAMILY MEMBERS:   Individuals in this family might be at some increased risk of developing cancer, over the general population risk, due to the family history of cancer.  Individuals in the family should notify their providers of the family history of cancer. We recommend women in this family have a yearly mammogram beginning at age 22, or 13 years younger than the earliest onset of cancer, an annual clinical breast exam, and perform monthly breast self-exams.  Family members should have colonoscopies by at age 63, or earlier, as recommended by their providers. Other members of the family may still carry a pathogenic variant in one of these genes that Ms. Manfred did not inherit. Based on the family history, we recommend her father/paternal relatives have genetic counseling and  testing. Ms. Wedeking will let us know if we can be of any assistance in coordinating genetic counseling and/or testing for this family member.     FOLLOW-UP:  Lastly, we discussed with Ms. Aberg that cancer genetics is a rapidly advancing field and it is possible that new genetic tests will be appropriate for her and/or her family members in the future. We encouraged her to remain in contact with cancer genetics on an annual basis so we can update her personal and family histories and let her know of advances in cancer genetics that may benefit this family.   Our contact number was provided. Ms. Suastegui's questions were answered to her satisfaction, and she knows she is welcome to call us at anytime with additional questions or concerns.    Lacy Duverney, MS, [facility] Genetic Counselor [email or web address] Phone: [phone]

## 2023-12-06 ENCOUNTER — Telehealth: Payer: Commercial Managed Care - PPO | Admitting: Family Medicine

## 2023-12-06 DIAGNOSIS — J111 Influenza due to unidentified influenza virus with other respiratory manifestations: Secondary | ICD-10-CM | POA: Diagnosis not present

## 2023-12-06 MED ORDER — OSELTAMIVIR PHOSPHATE 75 MG PO CAPS: 75.0000 mg | ORAL_CAPSULE | Freq: Two times a day (BID) | ORAL | 0 refills | Status: AC

## 2023-12-06 NOTE — Progress Notes (Signed)
 Virtual Visit Consent   Karla Hughes, you are scheduled for a virtual visit with a [facility] provider today. Just as with appointments in the office, your consent must be obtained to participate. Your consent will be active for this visit and any virtual visit you may have with one of our providers in the next 365 days. If you have a MyChart account, a copy of this consent can be sent to you electronically.  As this is a virtual visit, video technology does not allow for your provider to perform a traditional examination. This may limit your provider's ability to fully assess your condition. If your provider identifies any concerns that need to be evaluated in person or the need to arrange testing (such as labs, EKG, etc.), we will make arrangements to do so. Although advances in technology are sophisticated, we cannot ensure that it will always work on either your end or our end. If the connection with a video visit is poor, the visit may have to be switched to a telephone visit. With either a video or telephone visit, we are not always able to ensure that we have a secure connection.  By engaging in this virtual visit, you consent to the provision of healthcare and authorize for your insurance to be billed (if applicable) for the services provided during this visit. Depending on your insurance coverage, you may receive a charge related to this service.  I need to obtain your verbal consent now. Are you willing to proceed with your visit today? Kennetta Pavlovic [place] has provided verbal consent on 12/06/2023 for a virtual visit (video or telephone). Loa Lamp, FNP  Date: 12/06/2023 9:12 AM  Virtual Visit via Video Note   I, Loa Lamp, connected with  Karla Hughes  ([id number], July 05, 1996) on 12/06/23 at  9:15 AM EST by a video-enabled telemedicine application and verified that I am speaking with the correct person using two identifiers.  Location: Patient: Virtual Visit Location Patient:  Home Provider: Virtual Visit Location Provider: Home Office   I discussed the limitations of evaluation and management by telemedicine and the availability of in person appointments. The patient expressed understanding and agreed to proceed.    History of Present Illness: Karla Hughes is a 28 y.o. who identifies as a female who was assigned female at birth, and is being seen today for body aches, fever and chills with mild cough and runny nose for 2 days persistent. 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  Resting comfortably  at home.  Head is normocephalic, atraumatic.  No labored breathing.  Speech  is clear and coherent with logical content.  Patient is alert and oriented at baseline.    Assessment and Plan: 1. Influenza-like illness (Primary)  Increase fluids, continue dayquil, rest, Call with problems.   Follow Up Instructions: I discussed the assessment and treatment plan with the patient. The patient was provided an opportunity to ask  questions and all were answered. The patient agreed with the plan and demonstrated an understanding of the instructions.  A copy of instructions were sent to the patient via MyChart unless otherwise noted below.     The patient was advised to call back or seek an in-person evaluation if the symptoms worsen or if the condition fails to improve as anticipated.    [name], FNP

## 2023-12-06 NOTE — Patient Instructions (Signed)

## 2023-12-10 ENCOUNTER — Ambulatory Visit: Payer: Commercial Managed Care - PPO | Admitting: Internal Medicine

## 2023-12-10 ENCOUNTER — Encounter: Payer: Self-pay | Admitting: Internal Medicine

## 2023-12-10 VITALS — BP 110/80 | HR 130 | Temp 98.7°F | Ht 63.0 in | Wt 128.0 lb

## 2023-12-10 DIAGNOSIS — J0110 Acute frontal sinusitis, unspecified: Secondary | ICD-10-CM | POA: Diagnosis not present

## 2023-12-10 MED ORDER — DOXYCYCLINE HYCLATE 100 MG PO TABS: 100.0000 mg | ORAL_TABLET | Freq: Two times a day (BID) | ORAL | 0 refills | Status: DC

## 2023-12-10 NOTE — Progress Notes (Signed)
 Subjective:    Patient ID: Karla Hughes, female    DOB: 03-20-1996, 28 y.o.   MRN: [id number]  HPI Here due to respiratory infection  1 week ago--started with sore throat No fever, chills, aches Stayed out of work the next few days Woke several days later with ear pressure and ringing--the worst symptom (earache drops not helping) Head pressure and headache Woke yesterday with left > R eye discharge (matted shut on left x 2 days, then right also) Low grade fever Some cough since yesterday--now with some mucus (not clearly post nasal drip---but more when lying down) No SOB  Given tamiflu  at virtual visit Dayquil/nyquil Afrin---discussed being very limited with this  Current Outpatient Medications on File Prior to Visit  Medication Sig Dispense Refill   oseltamivir  (TAMIFLU ) 75 MG capsule Take 1 capsule (75 mg total) by mouth 2 (two) times daily for 5 days. 10 capsule 0   No current facility-administered medications on file prior to visit.    No Known Allergies  Past Medical History:  Diagnosis Date   Allergy    Headache     Past Surgical History:  Procedure Laterality Date   MYRINGOTOMY WITH TUBE PLACEMENT     x 2    Family History  Problem Relation Age of Onset   Multiple sclerosis Mother    Prostate cancer Father 65       Gleason 9   Multiple sclerosis Maternal Uncle    Skin cancer Maternal Grandfather        metastatic SCC   Breast cancer Paternal Grandmother 43   Colon cancer Paternal Grandmother 53   Lung cancer Paternal Grandfather    Breast cancer Other        mat great grandmother   Ovarian cancer Other        pat great aunt   Ovarian cancer Other        pat great grandmother    Social History   Socioeconomic History   Marital status: Married    Spouse name: Not on file   Number of children: Not on file   Years of education: Not on file   Highest education level: Not on file  Occupational History   Not on file  Tobacco Use   Smoking  status: Never   Smokeless tobacco: Never  Vaping Use   Vaping status: Never Used  Substance and Sexual Activity   Alcohol use: No    Alcohol/week: 0.0 standard drinks of alcohol   Drug use: No   Sexual activity: Never    Birth control/protection: Abstinence  Other Topics Concern   Not on file  Social History Narrative   Not on file   Social Drivers of Health   Financial Resource Strain: Not on file  Food Insecurity: Not on file  Transportation Needs: Not on file  Physical Activity: Not on file  Stress: Not on file  Social Connections: Not on file  Intimate Partner Violence: Not on file   Review of Systems No loss of smell or taste Appetite is off No N/V     Objective:   Physical Exam Constitutional:      Appearance: Normal appearance.  HENT:     Head:     Comments: Frontal pain but not tenderness    Mouth/Throat:     Comments: Mild pharyngeal injection Eyes:     Comments: Mild conjunctival injection  Pulmonary:     Effort: Pulmonary effort is normal.     Breath sounds:  Normal breath sounds. No wheezing or rales.  Musculoskeletal:     Cervical back: Neck supple.  Lymphadenopathy:     Cervical: No cervical adenopathy.  Neurological:     Mental Status: She is alert.            Assessment & Plan:

## 2023-12-10 NOTE — Assessment & Plan Note (Signed)
 Likely not the flu--but just about finished with the tamiflu Discussed analgesics Stop afrin Will try empiric doxy 100 bid x 7 days

## 2024-01-01 DIAGNOSIS — Z0279 Encounter for issue of other medical certificate: Secondary | ICD-10-CM

## 2024-01-03 ENCOUNTER — Telehealth: Payer: Self-pay

## 2024-01-03 NOTE — Telephone Encounter (Signed)
 Forms faxed to [employer]. Original with charge sheet sent to the front to be sent to scanning. Copy made and place in Faxed Forms Folder at my desk.

## 2024-02-17 ENCOUNTER — Encounter: Payer: Self-pay | Admitting: Internal Medicine

## 2024-02-17 ENCOUNTER — Ambulatory Visit (INDEPENDENT_AMBULATORY_CARE_PROVIDER_SITE_OTHER): Payer: Commercial Managed Care - PPO | Admitting: Internal Medicine

## 2024-02-17 VITALS — BP 128/78 | HR 95 | Temp 98.1°F | Resp 16 | Ht 64.0 in | Wt 128.8 lb

## 2024-02-17 DIAGNOSIS — Z0000 Encounter for general adult medical examination without abnormal findings: Secondary | ICD-10-CM | POA: Diagnosis not present

## 2024-02-17 DIAGNOSIS — Z87898 Personal history of other specified conditions: Secondary | ICD-10-CM

## 2024-02-17 DIAGNOSIS — Z13220 Encounter for screening for lipoid disorders: Secondary | ICD-10-CM | POA: Diagnosis not present

## 2024-02-17 DIAGNOSIS — I4711 Inappropriate sinus tachycardia, so stated: Secondary | ICD-10-CM

## 2024-02-17 DIAGNOSIS — Z803 Family history of malignant neoplasm of breast: Secondary | ICD-10-CM | POA: Diagnosis not present

## 2024-02-17 NOTE — Assessment & Plan Note (Signed)
 Physical today 02/17/24.  PAP 02/13/23 - negative with negative HPV.  Check routine labs, including cholesterol.

## 2024-02-17 NOTE — Progress Notes (Unsigned)
 Subjective:    Patient ID: Karla Hughes, female    DOB: [birth year], 28 y.o.   MRN: [id number]  Patient here for  Chief Complaint  Patient presents with   Annual Exam    HPI Here for a physical exam. S/p nexplanon insertion 02/14/22. She reports she is doing well. Trying to stay active. Breathing stable. No increased cough or congestion.    Past Medical History:  Diagnosis Date   Allergy    Headache    Past Surgical History:  Procedure Laterality Date   MYRINGOTOMY WITH TUBE PLACEMENT     x 2   Family History  Problem Relation Age of Onset   Multiple sclerosis Mother    Prostate cancer Father 43       Gleason 9   Multiple sclerosis Maternal Uncle    Skin cancer Maternal Grandfather        metastatic SCC   Breast cancer Paternal Grandmother 91   Colon cancer Paternal Grandmother 73   Lung cancer Paternal Grandfather    Breast cancer Other        mat great grandmother   Ovarian cancer Other        pat great aunt   Ovarian cancer Other        pat great grandmother   Social History   Socioeconomic History   Marital status: Married    Spouse name: Not on file   Number of children: Not on file   Years of education: Not on file   Highest education level: Not on file  Occupational History   Not on file  Tobacco Use   Smoking status: Never   Smokeless tobacco: Never  Vaping Use   Vaping status: Never Used  Substance and Sexual Activity   Alcohol use: No    Alcohol/week: 0.0 standard drinks of alcohol   Drug use: No   Sexual activity: Never    Birth control/protection: Abstinence  Other Topics Concern   Not on file  Social History Narrative   Not on file   Social Drivers of Health   Financial Resource Strain: Not on file  Food Insecurity: Not on file  Transportation Needs: Not on file  Physical Activity: Not on file  Stress: Not on file  Social Connections: Not on file     Review of Systems  Constitutional:  Negative for appetite change and  unexpected weight change.  HENT:  Negative for congestion, sinus pressure and sore throat.   Eyes:  Negative for pain and visual disturbance.  Respiratory:  Negative for cough, chest tightness and shortness of breath.   Cardiovascular:  Negative for chest pain, palpitations and leg swelling.  Gastrointestinal:  Negative for abdominal pain, diarrhea, nausea and vomiting.  Genitourinary:  Negative for difficulty urinating and dysuria.  Musculoskeletal:  Negative for joint swelling and myalgias.  Skin:  Negative for color change and rash.  Neurological:  Negative for dizziness and headaches.  Hematological:  Negative for adenopathy. Does not bruise/bleed easily.  Psychiatric/Behavioral:  Negative for agitation and dysphoric mood.        Objective:     BP 128/78   Pulse 95   Temp 98.1 F (36.7 C)   Resp 16   Ht 5\' 4"  (1.626 m)   Wt 128 lb 12.8 oz (58.4 kg)   SpO2 98%   BMI 22.11 kg/m  Wt Readings from Last 3 Encounters:  02/17/24 128 lb 12.8 oz (58.4 kg)  12/10/23 128 lb (58.1 kg)  02/13/23  126 lb 6.4 oz (57.3 kg)    Physical Exam Vitals reviewed.  Constitutional:      General: She is not in acute distress.    Appearance: Normal appearance. She is well-developed.  HENT:     Head: Normocephalic and atraumatic.     Right Ear: External ear normal.     Left Ear: External ear normal.     Mouth/Throat:     Pharynx: No oropharyngeal exudate or posterior oropharyngeal erythema.  Eyes:     General: No scleral icterus.       Right eye: No discharge.        Left eye: No discharge.     Conjunctiva/sclera: Conjunctivae normal.  Neck:     Thyroid: No thyromegaly.  Cardiovascular:     Rate and Rhythm: Normal rate and regular rhythm.  Pulmonary:     Effort: No tachypnea, accessory muscle usage or respiratory distress.     Breath sounds: Normal breath sounds. No decreased breath sounds or wheezing.  Chest:  Breasts:    Right: No inverted nipple, mass, nipple discharge or  tenderness (no axillary adenopathy).     Left: No inverted nipple, mass, nipple discharge or tenderness (no axilarry adenopathy).  Abdominal:     General: Bowel sounds are normal.     Palpations: Abdomen is soft.     Tenderness: There is no abdominal tenderness.  Musculoskeletal:        General: No swelling or tenderness.     Cervical back: Neck supple.  Lymphadenopathy:     Cervical: No cervical adenopathy.  Skin:    Findings: No erythema or rash.  Neurological:     Mental Status: She is alert and oriented to person, place, and time.  Psychiatric:        Mood and Affect: Mood normal.        Behavior: Behavior normal.         Outpatient Encounter Medications as of 02/17/2024  Medication Sig   [DISCONTINUED] doxycycline (VIBRA-TABS) 100 MG tablet Take 1 tablet (100 mg total) by mouth 2 (two) times daily.   No facility-administered encounter medications on file as of 02/17/2024.     Lab Results  Component Value Date   WBC 6.4 02/13/2023   HGB 15.1 (H) 02/13/2023   HCT 43.9 02/13/2023   PLT 247.0 02/13/2023   GLUCOSE 90 02/13/2023   CHOL 146 02/13/2023   TRIG 56.0 02/13/2023   HDL 47.30 02/13/2023   LDLCALC 88 02/13/2023   ALT 9 02/13/2023   AST 16 02/13/2023   NA 140 02/13/2023   K 3.9 02/13/2023   CL 104 02/13/2023   CREATININE 0.67 02/13/2023   BUN 14 02/13/2023   CO2 27 02/13/2023   TSH 0.80 02/13/2023   HGBA1C 5.0 03/19/2017       Assessment & Plan:  Routine general medical examination at a health care facility  Healthcare maintenance Assessment & Plan: Physical today 02/17/24.  PAP 02/13/23 - negative with negative HPV.  Check routine labs, including cholesterol.    History of tachycardia -     CBC with Differential/Platelet; Future -     Comprehensive metabolic panel with GFR; Future -     TSH; Future  Screening cholesterol level -     Lipid panel; Future  Family history of breast cancer Assessment & Plan: Had genetic testing. No pathogenic  variants identified.    Inappropriate sinus tachycardia (HCC) Assessment & Plan: Diagnosed by cardiology.  Was on diltiazem. Off medication and doing  well off. Follow.       Dale Evergreen, MD

## 2024-02-21 ENCOUNTER — Encounter: Payer: Self-pay | Admitting: Internal Medicine

## 2024-02-21 ENCOUNTER — Other Ambulatory Visit (INDEPENDENT_AMBULATORY_CARE_PROVIDER_SITE_OTHER)

## 2024-02-21 DIAGNOSIS — Z87898 Personal history of other specified conditions: Secondary | ICD-10-CM

## 2024-02-21 DIAGNOSIS — Z13220 Encounter for screening for lipoid disorders: Secondary | ICD-10-CM

## 2024-02-21 LAB — CBC WITH DIFFERENTIAL/PLATELET
Basophils Absolute: 0 10*3/uL (ref 0.0–0.1)
Basophils Relative: 0.4 % (ref 0.0–3.0)
Eosinophils Absolute: 0.1 10*3/uL (ref 0.0–0.7)
Eosinophils Relative: 1 % (ref 0.0–5.0)
HCT: 42.2 % (ref 36.0–46.0)
Hemoglobin: 14.3 g/dL (ref 12.0–15.0)
Lymphocytes Relative: 47.7 % — ABNORMAL HIGH (ref 12.0–46.0)
Lymphs Abs: 2.7 10*3/uL (ref 0.7–4.0)
MCHC: 33.8 g/dL (ref 30.0–36.0)
MCV: 89.4 fl (ref 78.0–100.0)
Monocytes Absolute: 0.4 10*3/uL (ref 0.1–1.0)
Monocytes Relative: 7.8 % (ref 3.0–12.0)
Neutro Abs: 2.4 10*3/uL (ref 1.4–7.7)
Neutrophils Relative %: 43.1 % (ref 43.0–77.0)
Platelets: 227 10*3/uL (ref 150.0–400.0)
RBC: 4.73 Mil/uL (ref 3.87–5.11)
RDW: 12.9 % (ref 11.5–15.5)
WBC: 5.6 10*3/uL (ref 4.0–10.5)

## 2024-02-21 LAB — COMPREHENSIVE METABOLIC PANEL WITH GFR
ALT: 11 U/L (ref 0–35)
AST: 16 U/L (ref 0–37)
Albumin: 4.6 g/dL (ref 3.5–5.2)
Alkaline Phosphatase: 77 U/L (ref 39–117)
BUN: 16 mg/dL (ref 6–23)
CO2: 28 meq/L (ref 19–32)
Calcium: 9.5 mg/dL (ref 8.4–10.5)
Chloride: 105 meq/L (ref 96–112)
Creatinine, Ser: 0.72 mg/dL (ref 0.40–1.20)
GFR: 115.65 mL/min (ref >60.00)
Glucose, Bld: 88 mg/dL (ref 70–99)
Potassium: 4 meq/L (ref 3.5–5.1)
Sodium: 141 meq/L (ref 135–145)
Total Bilirubin: 0.9 mg/dL (ref 0.2–1.2)
Total Protein: 7.3 g/dL (ref 6.0–8.3)

## 2024-02-21 LAB — LIPID PANEL
Cholesterol: 128 mg/dL (ref 0–200)
HDL: 43.4 mg/dL (ref >39.00)
LDL Cholesterol: 75 mg/dL (ref 0–99)
NonHDL: 84.89
Total CHOL/HDL Ratio: 3
Triglycerides: 50 mg/dL (ref 0.0–149.0)
VLDL: 10 mg/dL (ref 0.0–40.0)

## 2024-02-21 LAB — TSH: TSH: 0.74 u[IU]/mL (ref 0.35–5.50)

## 2024-02-21 NOTE — Assessment & Plan Note (Signed)
 Diagnosed by cardiology.  Was on diltiazem. Off medication and doing well off. Follow.

## 2024-02-21 NOTE — Assessment & Plan Note (Signed)
 Had genetic testing. No pathogenic variants identified.

## 2024-02-23 ENCOUNTER — Encounter: Payer: Self-pay | Admitting: Internal Medicine

## 2024-09-07 ENCOUNTER — Telehealth: Payer: Self-pay

## 2024-09-07 NOTE — Telephone Encounter (Signed)
 Copied from CRM [id number]. Topic: Appointments - Scheduling Inquiry for Clinic >> Sep 07, 2024 10:42 AM Karla Hughes wrote: Reason for CRM: Patient called in stating Dr. Glendia agreed to seeing her husband pendergrath,jack. Please call [phone] Karla Hughes) if no answer then call [phone]  Please let us  know whether or not Dr. Allena Glendia has agreed to accept patient's husband as a new patient.

## 2024-09-07 NOTE — Telephone Encounter (Signed)
 Ok

## 2024-12-09 ENCOUNTER — Telehealth: Payer: Self-pay

## 2024-12-09 DIAGNOSIS — Z3046 Encounter for surveillance of implantable subdermal contraceptive: Secondary | ICD-10-CM

## 2024-12-09 NOTE — Telephone Encounter (Signed)
 Copied from CRM [id number]. Topic: General - Other >> Dec 09, 2024 11:34 AM Karla Hughes wrote: Reason for CRM: Patient states she has the Nexplanon  in her arm, and  is due to be removed in march. And needs to know where she can go to have that removed. Please reach out to patient concerning this

## 2024-12-10 NOTE — Addendum Note (Signed)
 Addended by: Meena Barrantes on: 12/10/2024 01:02 PM   Modules accepted: Orders

## 2024-12-10 NOTE — Addendum Note (Signed)
 Addended by: GLENDIA ALLENA RAMAN on: 12/10/2024 01:07 PM   Modules accepted: Orders

## 2024-12-10 NOTE — Telephone Encounter (Signed)
 Mother and daughter were notified. Would like referral to Dr. Edrick Render  at  [facility] [place]

## 2024-12-10 NOTE — Telephone Encounter (Signed)
Order placed for gyn referral.

## 2024-12-10 NOTE — Telephone Encounter (Signed)
 Will need referral to gyn. Can place order for referral. She is she has a preference of which gyn she prefers to see.

## 2025-02-19 ENCOUNTER — Encounter: Admitting: Internal Medicine

## 2025-03-26 ENCOUNTER — Encounter: Admitting: Internal Medicine
